# Patient Record
Sex: Male | Born: 1935 | Race: White | Hispanic: No | Smoking: Never smoker
Health system: Southern US, Community
[De-identification: ages and names within clinical notes are randomized; demographics above are authoritative.]

## PROBLEM LIST (undated history)

## (undated) DIAGNOSIS — E039 Hypothyroidism, unspecified: Secondary | ICD-10-CM

## (undated) DIAGNOSIS — I350 Nonrheumatic aortic (valve) stenosis: Secondary | ICD-10-CM

## (undated) DIAGNOSIS — I4891 Unspecified atrial fibrillation: Secondary | ICD-10-CM

## (undated) DIAGNOSIS — D649 Anemia, unspecified: Secondary | ICD-10-CM

## (undated) DIAGNOSIS — N320 Bladder-neck obstruction: Secondary | ICD-10-CM

## (undated) DIAGNOSIS — H409 Unspecified glaucoma: Secondary | ICD-10-CM

## (undated) DIAGNOSIS — Z8619 Personal history of other infectious and parasitic diseases: Secondary | ICD-10-CM

## (undated) DIAGNOSIS — E785 Hyperlipidemia, unspecified: Secondary | ICD-10-CM

## (undated) DIAGNOSIS — G500 Trigeminal neuralgia: Secondary | ICD-10-CM

## (undated) DIAGNOSIS — H3530 Unspecified macular degeneration: Secondary | ICD-10-CM

## (undated) HISTORY — DX: Nonrheumatic aortic (valve) stenosis: I35.0

## (undated) HISTORY — DX: Unspecified glaucoma: H40.9

## (undated) HISTORY — DX: Trigeminal neuralgia: G50.0

## (undated) HISTORY — DX: Anemia, unspecified: D64.9

## (undated) HISTORY — DX: Hyperlipidemia, unspecified: E78.5

## (undated) HISTORY — DX: Unspecified atrial fibrillation: I48.91

## (undated) HISTORY — DX: Personal history of other infectious and parasitic diseases: Z86.19

## (undated) HISTORY — DX: Unspecified macular degeneration: H35.30

## (undated) HISTORY — DX: Bladder-neck obstruction: N32.0

## (undated) HISTORY — DX: Hypothyroidism, unspecified: E03.9

## (undated) HISTORY — PX: TONSILLECTOMY: SUR1361

---

## 1996-06-12 HISTORY — PX: ANAL FISSURECTOMY: SUR608

## 1997-06-12 HISTORY — PX: CATARACT EXTRACTION: SUR2

## 2001-10-17 HISTORY — PX: STEREOTACTIC RADIOSURGERY / PALLIDOTOMY: SUR1311

## 2004-04-26 ENCOUNTER — Ambulatory Visit: Payer: Self-pay | Admitting: Gastroenterology

## 2004-05-20 ENCOUNTER — Ambulatory Visit: Payer: Self-pay

## 2004-05-31 ENCOUNTER — Ambulatory Visit: Payer: Self-pay | Admitting: Unknown Physician Specialty

## 2006-03-15 ENCOUNTER — Ambulatory Visit: Payer: Self-pay | Admitting: Neurology

## 2010-10-14 ENCOUNTER — Ambulatory Visit: Payer: Self-pay

## 2010-10-15 ENCOUNTER — Emergency Department: Payer: Self-pay | Admitting: Internal Medicine

## 2010-10-17 ENCOUNTER — Encounter: Payer: Self-pay | Admitting: Internal Medicine

## 2012-09-08 ENCOUNTER — Emergency Department: Payer: Self-pay | Admitting: Emergency Medicine

## 2012-09-20 ENCOUNTER — Encounter: Payer: Self-pay | Admitting: Internal Medicine

## 2012-10-10 ENCOUNTER — Encounter: Payer: Self-pay | Admitting: Internal Medicine

## 2012-11-10 ENCOUNTER — Encounter: Payer: Self-pay | Admitting: Internal Medicine

## 2012-11-13 ENCOUNTER — Ambulatory Visit: Payer: Self-pay | Admitting: Internal Medicine

## 2012-11-14 LAB — CBC WITH DIFFERENTIAL/PLATELET
Basophil #: 0.1 10*3/uL (ref 0.0–0.1)
Basophil %: 0.9 %
Eosinophil #: 0.2 10*3/uL (ref 0.0–0.7)
Eosinophil %: 1.8 %
HGB: 11.9 g/dL — ABNORMAL LOW (ref 13.0–18.0)
Lymphocyte #: 2.7 10*3/uL (ref 1.0–3.6)
Lymphocyte %: 28 %
MCH: 32.5 pg (ref 26.0–34.0)
MCHC: 33.9 g/dL (ref 32.0–36.0)
Monocyte #: 2.1 x10 3/mm — ABNORMAL HIGH (ref 0.2–1.0)
Monocyte %: 22 %
Neutrophil %: 47.3 %
Platelet: 295 10*3/uL (ref 150–440)
RDW: 19.1 % — ABNORMAL HIGH (ref 11.5–14.5)
WBC: 9.6 10*3/uL (ref 3.8–10.6)

## 2012-11-14 LAB — COMPREHENSIVE METABOLIC PANEL
Albumin: 3.2 g/dL — ABNORMAL LOW (ref 3.4–5.0)
Alkaline Phosphatase: 85 U/L (ref 50–136)
Bilirubin,Total: 0.7 mg/dL (ref 0.2–1.0)
Calcium, Total: 8.6 mg/dL (ref 8.5–10.1)
Chloride: 104 mmol/L (ref 98–107)
Creatinine: 0.96 mg/dL (ref 0.60–1.30)
Glucose: 83 mg/dL (ref 65–99)
Osmolality: 275 (ref 275–301)
SGPT (ALT): 25 U/L (ref 12–78)
Sodium: 137 mmol/L (ref 136–145)

## 2012-11-14 LAB — TSH: Thyroid Stimulating Horm: 29.3 u[IU]/mL — ABNORMAL HIGH

## 2012-11-14 LAB — DIGOXIN LEVEL: Digoxin: 0.66 ng/mL

## 2012-12-10 ENCOUNTER — Encounter: Payer: Self-pay | Admitting: Internal Medicine

## 2013-01-10 ENCOUNTER — Encounter: Payer: Self-pay | Admitting: Internal Medicine

## 2013-02-04 LAB — BASIC METABOLIC PANEL WITH GFR
Anion Gap: 5 — ABNORMAL LOW
BUN: 15 mg/dL
Calcium, Total: 8.7 mg/dL
Chloride: 105 mmol/L
Co2: 27 mmol/L
Creatinine: 0.97 mg/dL
EGFR (African American): >60
EGFR (Non-African Amer.): >60
Glucose: 79 mg/dL
Osmolality: 274
Potassium: 4.1 mmol/L
Sodium: 137 mmol/L

## 2013-02-10 ENCOUNTER — Encounter: Payer: Self-pay | Admitting: Internal Medicine

## 2013-02-11 LAB — BASIC METABOLIC PANEL
BUN: 14 mg/dL (ref 7–18)
Chloride: 105 mmol/L (ref 98–107)
Co2: 28 mmol/L (ref 21–32)
EGFR (African American): >60
EGFR (Non-African Amer.): >60
Glucose: 79 mg/dL (ref 65–99)
Osmolality: 275 (ref 275–301)
Sodium: 138 mmol/L (ref 136–145)

## 2013-03-12 ENCOUNTER — Encounter: Payer: Self-pay | Admitting: Internal Medicine

## 2013-03-18 ENCOUNTER — Inpatient Hospital Stay: Payer: Self-pay | Admitting: Internal Medicine

## 2013-03-18 LAB — TROPONIN I
Troponin-I: 0.06 ng/mL — ABNORMAL HIGH
Troponin-I: 0.14 ng/mL — ABNORMAL HIGH

## 2013-03-18 LAB — URINALYSIS, COMPLETE
Bilirubin,UR: NEGATIVE
Glucose,UR: NEGATIVE mg/dL (ref 0–75)
Leukocyte Esterase: NEGATIVE
Nitrite: NEGATIVE
Ph: 5 (ref 4.5–8.0)
Protein: 100
RBC,UR: 1 /HPF (ref 0–5)
Squamous Epithelial: <1
WBC UR: 1 /HPF (ref 0–5)

## 2013-03-18 LAB — COMPREHENSIVE METABOLIC PANEL
Anion Gap: 7 (ref 7–16)
Chloride: 104 mmol/L (ref 98–107)
Co2: 25 mmol/L (ref 21–32)
Creatinine: 1.59 mg/dL — ABNORMAL HIGH (ref 0.60–1.30)
Glucose: 110 mg/dL — ABNORMAL HIGH (ref 65–99)
Osmolality: 278 (ref 275–301)
SGPT (ALT): 27 U/L (ref 12–78)
Sodium: 136 mmol/L (ref 136–145)
Total Protein: 6.3 g/dL — ABNORMAL LOW (ref 6.4–8.2)

## 2013-03-18 LAB — CBC
HCT: 24.6 % — ABNORMAL LOW (ref 40.0–52.0)
HGB: 7.8 g/dL — ABNORMAL LOW (ref 13.0–18.0)
MCV: 81 fL (ref 80–100)
Platelet: 331 10*3/uL (ref 150–440)
RBC: 3.05 10*6/uL — ABNORMAL LOW (ref 4.40–5.90)
RDW: 19.1 % — ABNORMAL HIGH (ref 11.5–14.5)

## 2013-03-18 LAB — CK TOTAL AND CKMB (NOT AT ARMC)
CK, Total: 226 U/L (ref 35–232)
CK-MB: 3.3 ng/mL (ref 0.5–3.6)

## 2013-03-18 LAB — HEMOGLOBIN: HGB: 7.4 g/dL — ABNORMAL LOW (ref 13.0–18.0)

## 2013-03-18 LAB — TSH: Thyroid Stimulating Horm: 4.42 u[IU]/mL

## 2013-03-19 ENCOUNTER — Ambulatory Visit: Payer: Self-pay | Admitting: Internal Medicine

## 2013-03-19 LAB — COMPREHENSIVE METABOLIC PANEL
Albumin: 3.2 g/dL — ABNORMAL LOW (ref 3.4–5.0)
Alkaline Phosphatase: 76 U/L (ref 50–136)
Anion Gap: 6 — ABNORMAL LOW (ref 7–16)
BUN: 30 mg/dL — ABNORMAL HIGH (ref 7–18)
Bilirubin,Total: 0.8 mg/dL (ref 0.2–1.0)
Calcium, Total: 8.3 mg/dL — ABNORMAL LOW (ref 8.5–10.1)
Chloride: 106 mmol/L (ref 98–107)
Co2: 25 mmol/L (ref 21–32)
Creatinine: 1.37 mg/dL — ABNORMAL HIGH (ref 0.60–1.30)
EGFR (Non-African Amer.): 42 — ABNORMAL LOW
Glucose: 102 mg/dL — ABNORMAL HIGH (ref 65–99)
Osmolality: 280 (ref 275–301)
Potassium: 4.5 mmol/L (ref 3.5–5.1)

## 2013-03-19 LAB — CBC WITH DIFFERENTIAL/PLATELET
Eosinophil %: 0.8 %
HCT: 22.1 % — ABNORMAL LOW (ref 40.0–52.0)
HGB: 7.3 g/dL — ABNORMAL LOW (ref 13.0–18.0)
Lymphocyte #: 1.9 10*3/uL (ref 1.0–3.6)
Lymphocyte %: 25.1 %
MCV: 79 fL — ABNORMAL LOW (ref 80–100)
Monocyte #: 2.4 x10 3/mm — ABNORMAL HIGH (ref 0.2–1.0)
Monocyte %: 32.5 %
Platelet: 286 10*3/uL (ref 150–440)
RBC: 2.79 10*6/uL — ABNORMAL LOW (ref 4.40–5.90)
WBC: 7.4 10*3/uL (ref 3.8–10.6)

## 2013-03-19 LAB — APTT: Activated PTT: >160 secs (ref 23.6–35.9)

## 2013-03-19 LAB — CK TOTAL AND CKMB (NOT AT ARMC): CK-MB: 3 ng/mL (ref 0.5–3.6)

## 2013-03-19 LAB — HEMOGLOBIN: HGB: 7.4 g/dL — ABNORMAL LOW (ref 13.0–18.0)

## 2013-03-19 LAB — TROPONIN I: Troponin-I: 0.14 ng/mL — ABNORMAL HIGH

## 2013-03-20 ENCOUNTER — Encounter: Payer: Self-pay | Admitting: Internal Medicine

## 2013-03-20 LAB — BASIC METABOLIC PANEL
Anion Gap: 7 (ref 7–16)
Calcium, Total: 8.3 mg/dL — ABNORMAL LOW (ref 8.5–10.1)
Chloride: 106 mmol/L (ref 98–107)
Co2: 23 mmol/L (ref 21–32)
Creatinine: 1.31 mg/dL — ABNORMAL HIGH (ref 0.60–1.30)
EGFR (African American): 52 — ABNORMAL LOW
EGFR (Non-African Amer.): 45 — ABNORMAL LOW
Glucose: 84 mg/dL (ref 65–99)
Osmolality: 276 (ref 275–301)
Sodium: 136 mmol/L (ref 136–145)

## 2013-03-20 LAB — CBC WITH DIFFERENTIAL/PLATELET
Basophil #: 0.1 10*3/uL (ref 0.0–0.1)
Basophil %: 1.5 %
Eosinophil %: 2.8 %
HGB: 9.3 g/dL — ABNORMAL LOW (ref 13.0–18.0)
Lymphocyte #: 2.1 10*3/uL (ref 1.0–3.6)
Lymphocyte %: 24.4 %
MCHC: 32.7 g/dL (ref 32.0–36.0)
MCV: 81 fL (ref 80–100)
Monocyte #: 2.2 x10 3/mm — ABNORMAL HIGH (ref 0.2–1.0)
Monocyte %: 26.4 %
Neutrophil #: 3.8 10*3/uL (ref 1.4–6.5)
Neutrophil %: 44.9 %
Platelet: 302 10*3/uL (ref 150–440)
RDW: 17.7 % — ABNORMAL HIGH (ref 11.5–14.5)
WBC: 8.4 10*3/uL (ref 3.8–10.6)

## 2013-03-26 LAB — CBC WITH DIFFERENTIAL/PLATELET
Basophil #: 0.2 10*3/uL — ABNORMAL HIGH (ref 0.0–0.1)
Eosinophil #: 0.2 10*3/uL (ref 0.0–0.7)
HCT: 31.1 % — ABNORMAL LOW (ref 40.0–52.0)
Lymphocyte #: 2 10*3/uL (ref 1.0–3.6)
Lymphocyte %: 19.9 %
MCHC: 32.5 g/dL (ref 32.0–36.0)
Monocyte %: 23.7 %
Neutrophil #: 5.2 10*3/uL (ref 1.4–6.5)
Platelet: 326 10*3/uL (ref 150–440)
RBC: 3.79 10*6/uL — ABNORMAL LOW (ref 4.40–5.90)
RDW: 19 % — ABNORMAL HIGH (ref 11.5–14.5)
WBC: 9.9 10*3/uL (ref 3.8–10.6)

## 2013-03-27 LAB — CBC WITH DIFFERENTIAL/PLATELET
Eosinophil #: 0.3 10*3/uL (ref 0.0–0.7)
HCT: 34.5 % — ABNORMAL LOW (ref 40.0–52.0)
HGB: 11.1 g/dL — ABNORMAL LOW (ref 13.0–18.0)
Lymphocyte %: 25.7 %
MCHC: 32.2 g/dL (ref 32.0–36.0)
Neutrophil %: 49.1 %
RBC: 4.15 10*6/uL — ABNORMAL LOW (ref 4.40–5.90)
RDW: 19.3 % — ABNORMAL HIGH (ref 11.5–14.5)
WBC: 8.5 10*3/uL (ref 3.8–10.6)

## 2013-03-27 LAB — BASIC METABOLIC PANEL
Calcium, Total: 9 mg/dL (ref 8.5–10.1)
Co2: 27 mmol/L (ref 21–32)
Glucose: 83 mg/dL (ref 65–99)
Osmolality: 274 (ref 275–301)
Sodium: 138 mmol/L (ref 136–145)

## 2013-04-01 LAB — BASIC METABOLIC PANEL
BUN: 11 mg/dL (ref 7–18)
Calcium, Total: 8.7 mg/dL (ref 8.5–10.1)
Chloride: 107 mmol/L (ref 98–107)
Co2: 26 mmol/L (ref 21–32)
EGFR (Non-African Amer.): >60
Glucose: 81 mg/dL (ref 65–99)
Potassium: 3.3 mmol/L — ABNORMAL LOW (ref 3.5–5.1)
Sodium: 140 mmol/L (ref 136–145)

## 2013-04-01 LAB — CBC WITH DIFFERENTIAL/PLATELET
Basophil %: 1.4 %
Eosinophil #: 0.2 10*3/uL (ref 0.0–0.7)
HGB: 11.4 g/dL — ABNORMAL LOW (ref 13.0–18.0)
Lymphocyte #: 2 10*3/uL (ref 1.0–3.6)
Lymphocyte %: 22.1 %
MCV: 83 fL (ref 80–100)
Neutrophil #: 4.6 10*3/uL (ref 1.4–6.5)
Neutrophil %: 51.3 %
Platelet: 351 10*3/uL (ref 150–440)
WBC: 9 10*3/uL (ref 3.8–10.6)

## 2013-04-03 LAB — CBC WITH DIFFERENTIAL/PLATELET
Basophil #: 0.2 10*3/uL — ABNORMAL HIGH (ref 0.0–0.1)
Eosinophil #: 0.2 10*3/uL (ref 0.0–0.7)
Eosinophil %: 3.2 %
HGB: 11.8 g/dL — ABNORMAL LOW (ref 13.0–18.0)
Lymphocyte #: 2.9 10*3/uL (ref 1.0–3.6)
MCH: 27 pg (ref 26.0–34.0)
MCHC: 32.5 g/dL (ref 32.0–36.0)
MCV: 83 fL (ref 80–100)
Monocyte #: 1.5 x10 3/mm — ABNORMAL HIGH (ref 0.2–1.0)
Monocyte %: 18.7 %
Neutrophil %: 38.6 %
RDW: 21.8 % — ABNORMAL HIGH (ref 11.5–14.5)

## 2013-04-10 LAB — CBC WITH DIFFERENTIAL/PLATELET
Basophil %: 2.1 %
Eosinophil %: 4.9 %
HGB: 11 g/dL — ABNORMAL LOW (ref 13.0–18.0)
MCHC: 32.8 g/dL (ref 32.0–36.0)
Monocyte #: 1.3 x10 3/mm — ABNORMAL HIGH (ref 0.2–1.0)
Neutrophil #: 2.2 10*3/uL (ref 1.4–6.5)
Neutrophil %: 35.3 %
Platelet: 306 10*3/uL (ref 150–440)
RDW: 23.6 % — ABNORMAL HIGH (ref 11.5–14.5)
WBC: 6.2 10*3/uL (ref 3.8–10.6)

## 2013-04-12 ENCOUNTER — Ambulatory Visit: Payer: Self-pay | Admitting: Internal Medicine

## 2013-04-12 ENCOUNTER — Encounter: Payer: Self-pay | Admitting: Internal Medicine

## 2013-04-12 ENCOUNTER — Ambulatory Visit: Payer: Self-pay | Admitting: Nurse Practitioner

## 2013-04-24 ENCOUNTER — Encounter: Payer: Self-pay | Admitting: Internal Medicine

## 2013-04-24 LAB — CBC WITH DIFFERENTIAL/PLATELET
Basophil %: 0.3 %
Eosinophil #: 0 10*3/uL (ref 0.0–0.7)
Eosinophil %: 0 %
HGB: 11.9 g/dL — ABNORMAL LOW (ref 13.0–18.0)
Lymphocyte #: 1.5 10*3/uL (ref 1.0–3.6)
MCV: 84 fL (ref 80–100)
Monocyte #: 0.9 x10 3/mm (ref 0.2–1.0)
Neutrophil #: 2.6 10*3/uL (ref 1.4–6.5)
Platelet: 324 10*3/uL (ref 150–440)
RBC: 4.3 10*6/uL — ABNORMAL LOW (ref 4.40–5.90)

## 2013-05-12 ENCOUNTER — Encounter: Payer: Self-pay | Admitting: Internal Medicine

## 2013-06-12 ENCOUNTER — Encounter: Payer: Self-pay | Admitting: Internal Medicine

## 2013-06-19 LAB — CBC WITH DIFFERENTIAL/PLATELET
BASOS ABS: 0.1 10*3/uL (ref 0.0–0.1)
Basophil %: 1.8 %
Eosinophil #: 0.2 10*3/uL (ref 0.0–0.7)
Eosinophil %: 3.4 %
HCT: 37.8 % — ABNORMAL LOW (ref 40.0–52.0)
HGB: 12.8 g/dL — ABNORMAL LOW (ref 13.0–18.0)
LYMPHS ABS: 2.1 10*3/uL (ref 1.0–3.6)
Lymphocyte %: 30.2 %
MCH: 31.1 pg (ref 26.0–34.0)
MCHC: 33.7 g/dL (ref 32.0–36.0)
MCV: 92 fL (ref 80–100)
Monocyte #: 1.5 x10 3/mm — ABNORMAL HIGH (ref 0.2–1.0)
Monocyte %: 21.2 %
NEUTROS ABS: 3 10*3/uL (ref 1.4–6.5)
NEUTROS PCT: 43.4 %
Platelet: 325 10*3/uL (ref 150–440)
RBC: 4.11 10*6/uL — ABNORMAL LOW (ref 4.40–5.90)
RDW: 24.7 % — AB (ref 11.5–14.5)
WBC: 7 10*3/uL (ref 3.8–10.6)

## 2013-07-13 ENCOUNTER — Encounter: Payer: Self-pay | Admitting: Internal Medicine

## 2013-08-10 ENCOUNTER — Ambulatory Visit: Payer: Self-pay | Admitting: Nurse Practitioner

## 2013-08-10 ENCOUNTER — Encounter: Payer: Self-pay | Admitting: Internal Medicine

## 2013-09-10 ENCOUNTER — Encounter: Payer: Self-pay | Admitting: Internal Medicine

## 2013-10-10 ENCOUNTER — Encounter: Payer: Self-pay | Admitting: Internal Medicine

## 2013-10-10 ENCOUNTER — Ambulatory Visit: Payer: Self-pay | Admitting: Nurse Practitioner

## 2013-11-10 ENCOUNTER — Encounter: Payer: Self-pay | Admitting: Internal Medicine

## 2013-11-10 ENCOUNTER — Ambulatory Visit
Admit: 2013-11-10 | Disposition: A | Discharge status: Home / Self Care | Payer: Self-pay | Attending: Nurse Practitioner | Admitting: Nurse Practitioner

## 2013-12-01 ENCOUNTER — Inpatient Hospital Stay: Payer: Self-pay | Admitting: Internal Medicine

## 2013-12-01 LAB — BASIC METABOLIC PANEL
Anion Gap: 5 — ABNORMAL LOW (ref 7–16)
BUN: 15 mg/dL (ref 7–18)
CALCIUM: 9 mg/dL (ref 8.5–10.1)
CO2: 29 mmol/L (ref 21–32)
CREATININE: 1.06 mg/dL (ref 0.60–1.30)
Chloride: 103 mmol/L (ref 98–107)
EGFR (Non-African Amer.): 58 — ABNORMAL LOW
Glucose: 114 mg/dL — ABNORMAL HIGH (ref 65–99)
Osmolality: 276 (ref 275–301)
Potassium: 4.2 mmol/L (ref 3.5–5.1)
Sodium: 137 mmol/L (ref 136–145)

## 2013-12-01 LAB — CBC
HCT: 33.6 % — ABNORMAL LOW (ref 40.0–52.0)
HGB: 11.1 g/dL — ABNORMAL LOW (ref 13.0–18.0)
MCH: 34 pg (ref 26.0–34.0)
MCHC: 33 g/dL (ref 32.0–36.0)
MCV: 103 fL — ABNORMAL HIGH (ref 80–100)
PLATELETS: 271 10*3/uL (ref 150–440)
RBC: 3.26 10*6/uL — ABNORMAL LOW (ref 4.40–5.90)
RDW: 17.6 % — AB (ref 11.5–14.5)
WBC: 7.6 10*3/uL (ref 3.8–10.6)

## 2013-12-01 LAB — TROPONIN I: Troponin-I: <0.02 ng/mL

## 2013-12-02 LAB — CBC WITH DIFFERENTIAL/PLATELET
BASOS ABS: 0.1 10*3/uL (ref 0.0–0.1)
BASOS PCT: 0.8 %
Eosinophil #: 0.1 10*3/uL (ref 0.0–0.7)
Eosinophil %: 0.8 %
HCT: 33.5 % — ABNORMAL LOW (ref 40.0–52.0)
HGB: 11.2 g/dL — AB (ref 13.0–18.0)
Lymphocyte #: 1 10*3/uL (ref 1.0–3.6)
Lymphocyte %: 9 %
MCH: 34.5 pg — ABNORMAL HIGH (ref 26.0–34.0)
MCHC: 33.4 g/dL (ref 32.0–36.0)
MCV: 103 fL — ABNORMAL HIGH (ref 80–100)
MONOS PCT: 27.8 %
Monocyte #: 3.2 x10 3/mm — ABNORMAL HIGH (ref 0.2–1.0)
NEUTROS PCT: 61.6 %
Neutrophil #: 7.1 10*3/uL — ABNORMAL HIGH (ref 1.4–6.5)
Platelet: 245 10*3/uL (ref 150–440)
RBC: 3.24 10*6/uL — ABNORMAL LOW (ref 4.40–5.90)
RDW: 17.1 % — ABNORMAL HIGH (ref 11.5–14.5)
WBC: 11.6 10*3/uL — ABNORMAL HIGH (ref 3.8–10.6)

## 2013-12-02 LAB — BASIC METABOLIC PANEL
Anion Gap: 5 — ABNORMAL LOW (ref 7–16)
BUN: 19 mg/dL — ABNORMAL HIGH (ref 7–18)
Calcium, Total: 8.8 mg/dL (ref 8.5–10.1)
Chloride: 102 mmol/L (ref 98–107)
Co2: 29 mmol/L (ref 21–32)
Creatinine: 1.16 mg/dL (ref 0.60–1.30)
EGFR (Non-African Amer.): 52 — ABNORMAL LOW
GFR CALC AF AMER: 60 — AB
GLUCOSE: 108 mg/dL — AB (ref 65–99)
OSMOLALITY: 275 (ref 275–301)
Potassium: 4.3 mmol/L (ref 3.5–5.1)
Sodium: 136 mmol/L (ref 136–145)

## 2013-12-02 LAB — MAGNESIUM: MAGNESIUM: 2 mg/dL

## 2013-12-10 ENCOUNTER — Encounter: Payer: Self-pay | Admitting: Internal Medicine

## 2013-12-22 ENCOUNTER — Emergency Department: Payer: Self-pay | Admitting: Emergency Medicine

## 2014-01-10 ENCOUNTER — Encounter: Payer: Self-pay | Admitting: Internal Medicine

## 2014-02-10 ENCOUNTER — Encounter: Payer: Self-pay | Admitting: Internal Medicine

## 2014-02-22 LAB — URINALYSIS, COMPLETE
BILIRUBIN, UR: NEGATIVE
BLOOD: NEGATIVE
Bacteria: NONE SEEN
GLUCOSE, UR: NEGATIVE mg/dL (ref 0–75)
KETONE: NEGATIVE
LEUKOCYTE ESTERASE: NEGATIVE
NITRITE: NEGATIVE
Ph: 5 (ref 4.5–8.0)
Protein: NEGATIVE
RBC, UR: NONE SEEN /HPF (ref 0–5)
SPECIFIC GRAVITY: 1.017 (ref 1.003–1.030)
Squamous Epithelial: NONE SEEN
WBC UR: 1 /HPF (ref 0–5)

## 2014-02-24 LAB — URINE CULTURE

## 2014-03-12 ENCOUNTER — Encounter: Payer: Self-pay | Admitting: Internal Medicine

## 2014-04-12 ENCOUNTER — Encounter: Payer: Self-pay | Admitting: Internal Medicine

## 2014-05-12 ENCOUNTER — Encounter: Payer: Self-pay | Admitting: Internal Medicine

## 2014-05-14 LAB — BASIC METABOLIC PANEL
ANION GAP: 8 (ref 7–16)
BUN: 14 mg/dL (ref 7–18)
Calcium, Total: 8.6 mg/dL (ref 8.5–10.1)
Chloride: 105 mmol/L (ref 98–107)
Co2: 27 mmol/L (ref 21–32)
Creatinine: 1.03 mg/dL (ref 0.60–1.30)
EGFR (African American): >60
Glucose: 81 mg/dL (ref 65–99)
Osmolality: 279 (ref 275–301)
POTASSIUM: 3.8 mmol/L (ref 3.5–5.1)
SODIUM: 140 mmol/L (ref 136–145)

## 2014-05-14 LAB — TSH: THYROID STIMULATING HORM: 22.9 u[IU]/mL — AB

## 2014-06-12 ENCOUNTER — Encounter: Payer: Self-pay | Admitting: Internal Medicine

## 2014-07-09 LAB — TSH: THYROID STIMULATING HORM: 0.591 u[IU]/mL

## 2014-07-13 ENCOUNTER — Encounter: Payer: Self-pay | Admitting: Internal Medicine

## 2014-08-11 ENCOUNTER — Encounter
Admit: 2014-08-11 | Disposition: A | Discharge status: Home / Self Care | Payer: Self-pay | Attending: Internal Medicine | Admitting: Internal Medicine

## 2014-09-11 ENCOUNTER — Encounter
Admit: 2014-09-11 | Disposition: A | Discharge status: Home / Self Care | Payer: Self-pay | Attending: Internal Medicine | Admitting: Internal Medicine

## 2014-09-11 ENCOUNTER — Ambulatory Visit: Payer: Self-pay | Admitting: Nurse Practitioner

## 2014-10-02 NOTE — Consult Note (Signed)
Brief Consult Note: Diagnosis: Anemia, + FOBT.   Patient was seen by consultant.   Consult note dictated.   Discussed with Attending MD.   Comments: 81 y/o male with anemia and + FOBT.  Given his age and  comorbidities, doubt the benefit of EGD or colonoscopy would outweight the risk.  Also unlikely to change management.  Agree with blood transfusion, PO iron therapy,  monitoring of h/h.  No further recs at this time.  Electronic Signatures: Dow Adolphein, Rainee Sweatt (MD)  (Signed 08-Oct-14 12:19)  Authored: Brief Consult Note   Last Updated: 08-Oct-14 12:19 by Dow Adolphein, Saundra Gin (MD)

## 2014-10-02 NOTE — Consult Note (Signed)
General Aspect 81 year old male with history of aortic stenosis mitral stenosis atrial fibrillation and coronary artery disease presenting to [facility] after he had a syncopal episode at his assisted care facility this a.m.  It was witnessed by his caretaker and was sitting on the side of the bed and slumped over.  By the time the daughter arrived at the facility the patient had normal mentation.  He did appear very pale.  He has been taking more pain pills due to chronic back pain over the last few weeks.  He did have a pain pill last night.  His EKG revealed ST depression in V4 through V6.  His troponin is barely elevated at 0.06.  He recently had some outpatient Tests performed by Dr. Satira Mccallum which a Holter monitor revealed atrophic relation with severe herbal ventricular response somewhat slow at times no pulses.  He also had an echocardiogram that showed normal LV function with trace mitral and moderate to severe tricuspid insufficiency with  moderate aortic stenosis and moderate mitral valve stenosis.  He currently is very talkative in the ER and without complaints.  He is anemic with a hemoglobin of 7.8 and creatinine of 1.6. Will follow serial enzymes are currently suspecting demand ischemia from renal insufficiency and anemia.According to his office note he is not a good candidate for percutaneous or surgical valvular intervention.   Physical Exam:  GEN well developed, no acute distress   HEENT pale conjunctivae, hearing intact to voice, moist oral mucosa   RESP normal resp effort   CARD Irregular rate and rhythm  Murmur  A. fib controlled   Murmur Systolic   Systolic Murmur Out flow   ABD soft   EXTR negative edema, TED hose on   SKIN skin turgor decreased   NEURO cranial nerves intact, motor/sensory function intact   PSYCH alert, A+O to time, place, person, Pleasantly confused   Review of Systems:  Subjective/Chief Complaint Syncope   Neurologic: Fainting   Review of Systems:  All other systems were reviewed and found to be negative   Lab Results: Thyroid:  07-Oct-14 09:50   Thyroid Stimulating Hormone 4.42 (0.45-4.50 (International Unit)  ----------------------- Pregnant patients have  different reference  ranges for TSH:  - - - - - - - - - -  Pregnant, first trimetser:  0.36 - 2.50 uIU/mL)  Hepatic:  07-Oct-14 09:50   Bilirubin, Total 0.9  Alkaline Phosphatase 87  SGPT (ALT) 27  SGOT (AST) 29  Total Protein, Serum  6.3  Albumin, Serum 3.5  TDMs:  07-Oct-14 09:50   Digoxin, Serum 0.9 (Therapeutic range for digoxin in patients with atrial fibrillation: 0.8 - 2.0 ng/mL. In patients with congestive heart failure a therapeutic range of 0.5 - 0.8 ng/mL is suggested as higher levels are associated with an increased risk of toxicity without clear evidence of enhanced efficacy. Digoxin toxicity is commonly associated with serum levels > 2.0 ng/mL but may occur with lower levels, including those in the therapeutic range. Blood samples should be obtained 6-8 hours after administration to assure a reasonable volume of distribution.)  Routine Chem:  07-Oct-14 09:50   Glucose, Serum  110  BUN  28  Creatinine (comp)  1.59  Sodium, Serum 136  Potassium, Serum 4.5  Chloride, Serum 104  CO2, Serum 25  Calcium (Total), Serum 8.8  Osmolality (calc) 278  eGFR (African American)  41  eGFR (Non-African American)  35 (eGFR values <47m/min/1.73 m2 may be an indication of chronic kidney disease (CKD). Calculated  eGFR is useful in patients with stable renal function. The eGFR calculation will not be reliable in acutely ill patients when serum creatinine is changing rapidly. It is not useful in  patients on dialysis. The eGFR calculation may not be applicable to patients at the low and high extremes of body sizes, pregnant women, and vegetarians.)  Anion Gap 7  Result Comment TROPONIN - RESULTS VERIFIED BY REPEAT TESTING.  - C/DR.WILLIAMS AT 1129  03/18/13-DAS  - READ-BACK PROCESS PERFORMED.  Result(s) reported on 18 Mar 2013 at 11:31AM.  Cardiac:  07-Oct-14 09:50   Troponin I  0.06 (0.00-0.05 0.05 ng/mL or less: NEGATIVE  Repeat testing in 3-6 hrs  if clinically indicated. >0.05 ng/mL: POTENTIAL  MYOCARDIAL INJURY. Repeat  testing in 3-6 hrs if  clinically indicated. NOTE: An increase or decrease  of 30% or more on serial  testing suggests a  clinically important change)  CK, Total 226  CPK-MB, Serum 3.3 (Result(s) reported on 18 Mar 2013 at 11:00AM.)  Routine UA:  07-Oct-14 12:51   Color (UA) Yellow  Clarity (UA) Hazy  Glucose (UA) Negative  Bilirubin (UA) Negative  Ketones (UA) Trace  Specific Gravity (UA) 1.023  Blood (UA) Negative  pH (UA) 5.0  Protein (UA) 100 mg/dL  Nitrite (UA) Negative  Leukocyte Esterase (UA) Negative (Result(s) reported on 18 Mar 2013 at 01:34PM.)  RBC (UA) 1 /HPF  WBC (UA) 1 /HPF  Bacteria (UA) NONE SEEN  Epithelial Cells (UA) <1 /HPF  Mucous (UA) PRESENT  Hyaline Cast (UA) 17 /LPF (Result(s) reported on 18 Mar 2013 at 01:34PM.)  Routine Hem:  07-Oct-14 09:50   WBC (CBC)  11.0  RBC (CBC)  3.05  Hemoglobin (CBC)  7.8  Hematocrit (CBC)  24.6  Platelet Count (CBC) 331 (Result(s) reported on 18 Mar 2013 at 10:42AM.)  MCV 81  MCH  25.6  MCHC  31.8  RDW  19.1   Radiology Results: XRay:    07-Oct-14 10:57, Chest PA and Lateral  Chest PA and Lateral   REASON FOR EXAM:    dyspnea  COMMENTS:       PROCEDURE: DXR - DXR CHEST PA (OR AP) AND LATERAL  - Mar 18 2013 10:57AM     RESULT: Comparison is made to the study of 11/13/2012.    Dense atherosclerotic calcification is seen in the aorta. The heart is   mildly enlarged. The lung markings are coarse. There is no consolidation,   effusion or pneumothorax. Degenerative changes and osteopenia are present   the bony structures.    IMPRESSION:  Underlying mild cardiomegaly with COPD and possibly some   underlying fibrosis or mild  interstitial edema.  Dictation Site: 2        Verified By: Sundra Aland, M.D., MD  CT:    07-Oct-14 10:47, CT Head Without Contrast  CT Head Without Contrast   REASON FOR EXAM:    syncope  COMMENTS:       PROCEDURE: CT  - CT HEAD WITHOUT CONTRAST  - Mar 18 2013 10:47AM     RESULT: Comparison:  None    Technique: Multiple axial images from the foramen magnum to the vertex   were obtained without IV contrast.    Findings:      There is no evidence of mass effect, midline shift, or extra-axial fluid   collections.  There is no evidence of a space-occupying lesion or   intracranial hemorrhage. There is no evidence of a cortical-based area of  acute infarction. There is generalized cerebral atrophy. There is   periventricular Slappey matter low attenuation likely secondary to   microangiopathy.    The ventricles and sulci are appropriate for the patient's age. The basal   cisterns are patent.    Visualized portions of the orbits are unremarkable. The visualized   portions of the paranasal sinuses and mastoid air cells are unremarkable.   Cerebrovascular atherosclerotic calcifications are noted.    The osseous structures are unremarkable.    IMPRESSION:    No acute intracranial process.        Dictation Site: 1        Verified By: Jennette Banker, M.D., MD    Tegretol: Unknown  Vital Signs/Nurse's Notes: **Vital Signs.:   07-Oct-14 17:05  Temperature Temperature (F) 97.3  Celsius 36.2  Temperature Source oral  Pulse Pulse 91  Respirations Respirations 18  Systolic BP Systolic BP 360  Diastolic BP (mmHg) Diastolic BP (mmHg) 73  Mean BP 93  Pulse Ox % Pulse Ox % 97  Pulse Ox Activity Level  At rest  Oxygen Delivery Room Air/ 21 %    Impression 81 year old male with history of CAD, Borderline troponin, chronic Atrial fibrillation with variable ventricular response, aortic and mitral valve stenosis, Anemia, renal insufficiency with syncope, Questionable  etiology.   Plan 1.  Continue to watch serial enzymes but for now think the borderline Troponin elevation is due to demand ischemia from anemia and renal insufficiency and  not acute coronary syndrome. 2.  Address anemia, renal insufficiency, possible dehydration,  3.  Aortic and mitral stenosis, patient thought not to be a good candidate for either percutaneous or surgical intervention.A repeat surface echocardiogram is not needed since he just had one outpatient 02/06/13. 4.Watch for significant arrhythmias on telemetry especially bradycardia or pauses that might indicate discontinuation of digoxin and/or the need for pacemaker. 5.  Further review and recommendations per Dr. Nehemiah Massed.   Electronic Signatures: Roderic Palau (NP)  (Signed 07-Oct-14 17:54)  Authored: General Aspect/Present Illness, History and Physical Exam, Review of System, Labs, Radiology, Allergies, Vital Signs/Nurse's Notes, Impression/Plan   Last Updated: 07-Oct-14 17:54 by Roderic Palau (NP)

## 2014-10-02 NOTE — H&P (Signed)
PATIENT NAME:  Clifford Porter, CARDINAL MR#:  [id number] DATE OF BIRTH:  14-Jan-1912  DATE OF ADMISSION:  03/18/2013  PRIMARY CARE PHYSICIAN: Aram Beecham.   HISTORY OF PRESENT ILLNESS: The patient is a 81 year old Caucasian male with past medical history significant for history of constipation, glaucoma, dementia, BPH, chronic pain syndrome due to back pain, hypothyroidism and A-fib who presents to the hospital with complaints of syncopal episodes. Apparently, the patient was found while he was sitting in the bed unresponsive or poorly responsive. He also urinated on himself and was not aware about that. He lives in an assisted living facility. Apparently, assistant came to his room to help him to dress and he was found to be slumped over, unresponsive or poorly responsive. He was able to initially say hello, but then kept repeating the word yes and later on his mental status normalized. He was complaining of some shortness of breath and now upon questioning he also admits of having some chest pains, but not able to provide much more history. According to the patient's family, he seemed to be dehydrated as he is not able to eat or drink plenty. He is not losing weight according to family members. The patient has been having problems with shortness of breath on and off for several days now. While in the hospital, in the Emergency Room, he was also complaining of left hand pains. He was also telling family that he was somewhat lightheaded in the morning. He was found to have elevated troponin and significant anemia. His hemoglobin level dropped, just down, by 4 grams since summer. Hospitalist services were contacted for admission.   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  ALLERGIES: I am not able to get.  PAST SURGICAL HISTORY: I am not able to get.  FAMILY HISTORY: I am not able to get.  SOCIAL HISTORY: I am not able to get. I know that he lives in an assisted living facility. He does not drink or never drank and does not smoke or never smoked.  REVIEW OF SYSTEMS: Positive for pains in his chest, questionable pain, shortness of breath on and off, unclear how long, macular degeneration and the patient is almost blind. Admits of having some phlegm production, sinus congestion. Admits of having some cough with amber-colored phlegm and shortness of breath. Questionable chest pains. Dyspnea on exertion. Also feeling lightheaded and dizzy and weak earlier today. Poor appetite yesterday. Poor fluid intake. In general frequent constipation. Last bowel movement was approximately 2 days ago. The patient admits of having decreased urinary frequency. He did not go to the bathroom earlier today and had to be catheterized from his bladder here in the Emergency Room. Admits of having some right arm numbness intermittently, also left hand pain while in the Emergency Room.  CONSTITUTIONAL: Otherwise, denies fevers, chills, fatigue, weakness, weight loss or gain.  EYES: Denies any blurry vision, double vision, glaucoma or cataracts.  EARS, NOSE, THROAT: Denies tinnitus, allergies, epistaxis, sinus pain, dentures or difficulty swallowing.  RESPIRATORY: Denies any wheezing, asthma or COPD. CARDIOVASCULAR: Denies any orthopnea, edema, arrhythmias, palpitations or syncope.  GASTROINTESTINAL: Denies nausea, vomiting, diarrhea, rectal bleeding, change in bowel habits.  GENITOURINARY: Denies dysuria, hematuria, frequency or incontinence.  ENDOCRINE: Denies any polydipsia, nocturia, thyroid problems, heat or cold intolerance or thirst. HEMATOLOGIC: Denies  anemia, easy bruising, bleeding or swollen glands.  SKIN: Denies any acne, rashes or change in moles.  MUSCULOSKELETAL: Denies arthritis, cramps, swelling, gout.  NEUROLOGIC: Nausea numbness, epilepsy or tremor. PSYCHIATRIC: Denies anxiety, insomnia or depression.   PHYSICAL EXAMINATION: GENERAL: Well-developed, well-nourished, pale Caucasian male lying on the stretcher.  HEENT: Pupils are equal and reactive to light. Extraocular movements intact. No icterus or conjunctivitis. Has difficulty hearing. No pharyngeal erythema. Mucosa is very dry.  NECK: No mass. Supple and nontender. Thyroid is not enlarged. No adenopathy. No JVD, however, the patient has carotid bruit on the left. Full range of motion. LUNGS: Clear to auscultation in all fields. Somewhat diminished breath sounds, otherwise, no rhonchi or wheezing. No labored inspirations, increased effort, dullness to percussion, overt respiratory distress.  CARDIOVASCULAR: S1 and S2 appreciated. Systolic murmur was heard, 4/6, in all  precordium radiating to his left neck as well as axilla. Chest is nontender to palpation. Diminished pedal pulses.  EXTREMITIES: No lower extremity edema, calf tenderness or cyanosis was noted.  ABDOMEN: Soft, nontender. Bowel sounds are present. No hepatosplenomegaly or masses were noted. RECTAL: Done by Emergency Room physician, was guaiac-positive.  MUSCLE STRENGTH: Able to move all extremities. Weakness in the lower extremities was noted as well as upper extremity but lower extremity weakness seemed to be more pronounced. No cyanosis. The patient had difficulty sitting up in the bed. He has mild kyphosis. Gait is not tested.  SKIN: Did not reveal any rashes, lesions, erythema, nodularity or induration. It was warm and dry to palpation.  LYMPHATIC: No adenopathy in the cervical region.  NEUROLOGICAL: Cranial nerves grossly intact. Sensory is intact. No dysarthria or aphasia. The patient is alert, oriented to person  and place. He is cooperative; however, his memory is impaired. He is confused and he is talking about nonessential things and very difficult to get history from him.   LABORATORY AND DIAGNOSTICS: EKG reveals A-fib, rate of 83 beats per minute, normal axis, T depressions in lateral leads which are new since prior EKG done in March 2014.  BMP revealed elevation of BUN and creatinine to 28 and 1.59, glucose 110, bicarbonate level is normal at 25. Estimated GFR for non-African American would be 35. Liver enzymes are completely within normal limits, except for albumin level of 6.3. Cardiac enzymes showed elevation of troponin to 0.06. TSH normal at 4.42. Digoxin level is 0.9. Postle blood cell count is elevated to 11, hemoglobin 7.8 as compared to 11.9 in June 2014, and platelet count was 331. Urinalysis: Yellow hazy urine, negative for glucose or bilirubin. Trace ketones were noted. Specific gravity 1.023, pH was 5.0, negative for blood, 100 mg/dL protein, negative for nitrites or leukocyte esterase, 1 red blood cell, 1 Hauschild blood cell, no bacteria was seen, less than 1 epithelial cell, mucus was present as well as 17 hyaline casts.   Chest x-ray, PA and lateral, 7th of October 2014, revealed underlying mild cardiomegaly and COPD and possibly some underlying fibrosis or mild interstitial edema, according to radiology. CT scan of head without contrast, 7th of October 2014, reveals no acute intracranial process.   ASSESSMENT AND PLAN: 1.  Syncope. Admit the patient to the medical floor, off unit telemetry. 2.  Questionable orthostatic hypotension in view that the patient is dehydrated as well as anemic, but cannot rule out acute coronary syndrome. We will  admit the patient to the medical floor. As mentioned above, we will get orthostatic vital signs, will continue IV fluids, will get carotid ultrasound and echocardiogram as well as cardiology consultation. 3.  Elevated troponin with dyspnea. Will follow cardiac  enzymes. We will start the patient on metoprolol. Unable to use aspirin or Lovenox or heparin due to anemia, unfortunately. Will follow cardiac enzymes as well as getting echocardiogram and cardiology consultation.  4.  Renal insufficiency, likely dehydration. We will continue IV fluids. Urinalysis is unremarkable. We will get also bladder scan due to history of benign prostatic hypertrophy. 5.  Atrial fibrillation, rate controlled. Digoxin level is normal. Not on any anticoagulation and anticoagulation will not be initiated at this time due to gastrointestinal bleed.  6.  Anemia, guaiac-positive, questionable acute on chronic gastrointestinal bleed. Follow hemoglobins, transfuse as needed. Will continue proton pump inhibitors IV twice daily. We will get gastroenterology consultation and will make decisions about therapies and evaluation.  7.  Dementia. Supportive therapy.  8.  Constipation. We will continue home medications.  9.  Suspected peripheral vascular disease. The patient may benefit from vascular evaluation. Follow extremity weakness. Will get physical therapist involved. 10.  Hypothyroidism. We will continue Synthroid. The patient's TSH is therapeutic.   TIME SPENT: 50 minutes.  ____________________________ Katharina Caperima Marsalis Beaulieu, MD rv:sb D: 03/18/2013 15:36:51 ET T: 03/18/2013 16:13:28 ET JOB#: [id number]  cc: Katharina Caperima Anacaren Kohan, MD, <Dictator> Duane LopeJeffrey D. Judithann SheenSparks, MD Katharina CaperIMA Geovanni Rahming MD ELECTRONICALLY SIGNED 03/25/2013 12:33

## 2014-10-02 NOTE — Consult Note (Signed)
   Comments   I spoke at length with pt's daughter, Floreen Comberileen Simmons. Updated her on pt's medical condition. Told her of pt's reluctance to undergo aggressive testing. Awaiting GI consult. I also talked to pt and daughter about his potential need for a higher level of care at discharge. SW has been consulted to assist with this.   Electronic Signatures: Subrena Devereux, Harriett SineNancy (MD)  (Signed 08-Oct-14 10:25)  Authored: Palliative Care   Last Updated: 08-Oct-14 10:25 by Bodey Frizell, Harriett SineNancy (MD)

## 2014-10-02 NOTE — Discharge Summary (Signed)
PATIENT NAME:  Clifford Porter, Clifford Porter MR#:  [id number] DATE OF BIRTH:  [birth year]  DATE OF ADMISSION:  03/18/2013 DATE OF DISCHARGE: 03/20/2013  TYPE OF DISCHARGE: The patient is transferred to a skilled nursing facility.   REASON FOR ADMISSION: Syncope.   HISTORY OF PRESENT ILLNESS: The patient is a 81 year old male with a significant history of chronic atrial fibrillation, trigeminal nerve neuropathy, dementia, and BPH, who was living in assisted living. He was found sitting in bed, unresponsive. He had urinated on himself. He was brought to the Emergency Room where he was found to be profoundly anemic with guaiac-positive stools and was admitted for further evaluation. Upon admission his troponin was mildly elevated as well.   PAST MEDICAL HISTORY: 1. Chronic atrial fibrillation.  2. Senile dementia.  3. Trigeminal nerve neuropathy.  4. BPH.  5. Glaucoma.  6. Chronic constipation.  7. Chronic pain.  8. Degenerative disk disease.  9. Hypothyroidism.  10. Peripheral vascular disease.   MEDICATIONS ON ADMISSION: Please see admission note.   ALLERGIES: TEGRETOL.   SOCIAL HISTORY, FAMILY HISTORY, REVIEW OF SYSTEMS: As per admission note.   PHYSICAL EXAM: GENERAL: The patient was in no acute distress.   VITAL SIGNS: Stable and he was afebrile.  HEENT: Exam was unremarkable.  NECK: Was supple without JVD. Carotid bruit on the left was noted.  LUNGS: Clear.  CARDIAC: Examination revealed an irregularly irregular rhythm with a 2/6 systolic murmur.  ABDOMEN: Soft and nontender.  EXTREMITIES: Without edema.  NEUROLOGIC: Exam was grossly nonfocal.   HOSPITAL COURSE: The patient was admitted with hypovolemic syncope due to GI bleed and anemia. His troponin was mildly elevated. He was seen in consultation by cardiology, who felt that the troponin elevation was due to demand ischemia. He was seen by GI, who recommended conservative therapy. He was transfused 2 units of packed red blood cells with  improvement of his blood pressure and hemoglobin. He was asymptomatic. His confusion remained stable. By 03/20/2013, the patient was stable and ready for discharge.   DISCHARGE DIAGNOSES: 1. Hypovolemic syncope.  2. Gastrointestinal bleed.  3. Anemia due to acute blood loss.  4. Chronic atrial fibrillation.  5. Senile dementia.  6. Peripheral vascular disease.  7. Benign prostatic hypertrophy.  8. Chronic constipation.  9. Hypothyroidism.   DISCHARGE MEDICATIONS: 1. Tylenol 650 mg p.o. q.6h. p.r.n. pain and fever.  2. Colace 100 mg p.o. b.i.d.  3. Atrovent nasal spray 2 puffs each nostril b.i.d.  4. Latanoprost eye drops one drop in each eye at bedtime.  5. Synthroid 75 mcg p.o. daily.  6. Nitro-Dur 0.1 mg topically daily, off at bedtime.  7. Zofran 4 mg p.o. q.4 hours p.r.n. nausea and vomiting.  8. Senna 1 p.o. b.i.d.  9. Flomax 0.4 mg p.o. daily.  10. Norco 5/325  1 to 2 p.o. q.4h. p.r.n. pain.  11. Iron sulfate 325 mg p.o. b.i.d.  12. Protonix 40 mg p.o. b.i.d.   FOLLOW-UP PLANS AND APPOINTMENTS: The patient will be followed by the resident physician at the skilled nursing facility. He is on a mechanical soft 2 grams sodium diet. CBC and a Met b  in one week. He will be seen in consultation by physical therapy.   ____________________________ Leonie Douglas. Doy Hutching, MD jds:sg D: 03/20/2013 12:04:27 ET T: 03/20/2013 12:31:38 ET JOB#: [id number]  cc: Leonie Douglas. Doy Hutching, MD, <Dictator> Amand Lemoine Lennice Sites MD ELECTRONICALLY SIGNED 03/20/2013 13:11

## 2014-10-02 NOTE — Consult Note (Signed)
PATIENT NAME:  Clifford Porter, Clifford Porter MR#:  [id number] DATE OF BIRTH:  [birth year]  DATE OF CONSULTATION:  03/19/2013  REFERRING PHYSICIAN:  Duane Lope. Judithann Sheen, MD CONSULTING PHYSICIAN:  Dow Adolph, MD  REASON FOR THE CONSULT: Anemia, positive FOBT.   HISTORY OF PRESENT ILLNESS: Clifford Porter is a 81 year old male with a past medical history notable for atrial fibrillation, dementia, chronic pain syndrome, who was brought to the hospital for an episode of syncope and unresponsiveness at his skilled nursing facility. GI is consulted for evaluation of anemia and positive FOBT. [name] denies seeing any blood in his bowel movements or any black bowel movements. He is unsure when he has had his last colonoscopy. He is not aware of ever being told that he has trouble with low blood counts.   His hospital course has been notable for an elevation in his troponins. He also on presentation was noted to be anemic. He had stool guaiac testing done that was positive for occult blood.   PAST MEDICAL HISTORY: 1.  A. fib.  2.  Constipation.  3.  Glaucoma.  4.  Dementia.  5.  BPH.  6.  Chronic pain syndrome due to herniated disk. 7.  Hypothyroidism.  8.  Peripheral vascular disease.   ALLERGIES: Unknown.   PAST SURGICAL HISTORY: Unknown.   FAMILY HISTORY: Unknown, but he denies any family history of colon cancer.   SOCIAL HISTORY: He lives in a skilled nursing facility. He denies any alcohol or tobacco.   REVIEW OF SYSTEMS: A 10-system review was conducted. It is negative except as stated in the HPI. In addition, he does complain of some pain in his left foot.   PHYSICAL EXAMINATION:  VITAL SIGNS: Temperature is 97.3. Pulse is 74. Respirations are 18. Blood pressure 146/84. He is 98% on 2 liters of O2.  GENERAL: Alert and oriented x 2.  No acute distress. Appears younger than stated age. HEENT: Normocephalic/atraumatic. Extraocular movements are intact. Anicteric. NECK: Soft, supple. JVP appears normal. No  adenopathy. CHEST: Clear to auscultation. No wheeze or crackle. Respirations unlabored. HEART: Regular. No murmur, rub, or gallop.  Normal S1 and S2. ABDOMEN: Soft, nontender, nondistended.  Normal active bowel sounds in all four quadrants.  No organomegaly. No masses EXTREMITIES: No swelling, well perfused. SKIN: No rash or lesion. Skin color, texture, turgor normal. NEUROLOGICAL: Grossly intact. PSYCHIATRIC: Normal tone and affect. MUSCULOSKELETAL: No joint swelling or erythema.   LABORATORY DATA: Sodium is 137, potassium 4.5, chloride 106, bicarbonate 25, BUN 30, creatinine 1.37, total protein 5.6, albumin 3.2, alkaline phosphatase 76, AST 32, ALT 28. Dewberry count is 7.4; hemoglobin is 7.3, hematocrit 21; platelets are 286. His PTT is 37.6.   ASSESSMENT AND PLAN: Anemia, fecal occult blood test: His blood counts seem to be stable here in the hospital, although they are low. It does not seem as though he is having any overt gastrointestinal bleeding. He also is hemodynamically stable.   Given his significant advanced age, comorbidities and decreased mental status, I suspect the risk of any endoscopic procedures would outweigh the benefit. It is unlikely that we would find an intervenable lesion on endoscopy. Therefore, I would recommend against endoscopy at this time. I do agree with transfusions as necessary. Also, oral iron therapy will hopefully help to maintain his hemoglobins at an acceptable level. I do agree with a palliative care consult given his advanced age.   We will continue to monitor his hemoglobins. No additional recommendations at this time.   Thank  you for this consult.   ____________________________ Dow AdolphMatthew Justan Gaede, MD mr:jm D: 03/19/2013 17:50:26 ET T: 03/19/2013 19:13:12 ET JOB#: [id number]  cc: Dow AdolphMatthew Kaelyn Nauta, MD, <Dictator> Kathalene FramesMATTHEW G Shemekia Patane MD ELECTRONICALLY SIGNED 04/10/2013 16:24

## 2014-10-02 NOTE — Consult Note (Signed)
   Comments   I spoke with Johnnye SimaStuart Cox, CS[facility] at [facility]. Informed him that pt would likely be ready for discharge in AM and would need a higher level of care than ALF. He says that [facility]. I discussed with pt and with daughter, Floreen Comberileen Simmons. Both are in agreement with this plan.   Electronic Signatures: Truett Mcfarlan, Harriett SineNancy (MD)  (Signed 08-Oct-14 16:12)  Authored: Palliative Care   Last Updated: 08-Oct-14 16:12 by Mathayus Stanbery, Harriett SineNancy (MD)

## 2014-10-03 NOTE — Discharge Summary (Signed)
PATIENT NAME:  Clifford Porter, Clifford Porter DATE OF BIRTH:  1912-05-04  DATE OF ADMISSION:  12/01/2013 DATE OF DISCHARGE:  12/02/2013  DISCHARGE DIAGNOSES: 1. Fall and C6-7 ligamentous injury. 2. Chronic cough.  3. Severe Aortic Stenosis , baseline.   DISCHARGE MEDICATIONS: Per [facility] med reconciliation summary. Will be on his usual medications plus Advair 1 puff twice a day.   HISTORY AND PHYSICAL: Please see detailed history and physical done on admission.   HOSPITAL COURSE: The patient admitted with a fall. Initial CT was negative for fractures of his head and C-spine. He then had an MRI of his C-spine and he was found to have the injury noted above. Discussed this with neurosurgery and tentatively [facility]. They will need to follow him up as an outpatient and determine further length of time, but at least two weeks at this point prior to his follow-up. He has analgesia as needed for that.   TIME SPENT: It took approximately 35 minutes to coordinate all these discharge plans today and we will  need to talk further with the family when they are  available.   ____________________________ Marya AmslerMarshall Porter. Dareen PianoAnderson, MD mwa:sg D: 12/02/2013 08:19:16 ET T: 12/02/2013 08:31:46 ET JOB#: [id number]  cc: Marya AmslerMarshall Porter. Dareen PianoAnderson, MD, <Dictator> Lauro RegulusMARSHALL Porter ANDERSON MD ELECTRONICALLY SIGNED 12/02/2013 13:30

## 2014-10-03 NOTE — H&P (Signed)
PATIENT NAME:  Clifford Porter, Clifford Porter MR#:  [id number] DATE OF BIRTH:  Apr 06, 1912  DATE OF ADMISSION:  12/01/2013  PRIMARY CARE PHYSICIAN: Dr. Georgie Chard  REFERRING PHYSICIAN: Dr. Jacqualine Code  CHIEF COMPLAINT: Unwitnessed fall.   HISTORY OF PRESENT ILLNESS: The patient is a 81 year old Caucasian male with DNR code status who is sent over to the ED after he sustained an unwitnessed fall at a nursing home. He is complaining of severe neck pain. He was placed in a cervical collar and CT of the head and a CT of the cervical spine were done. CT of the head did not reveal any acute changes and  cervical spine has revealed no acute fractures, but as the patient was complaining of severe neck pain, MRI of the C-spine was ordered stat and hospitalist team is called to admit the patient. The patient has some degree of dementia and not great historian. He is just complaining of neck pain and asking [place] to get imaging studies done as soon as possible. Son, who has accompanied the patient, left the hospital prior to my arrival. The patient was placed on 2 liters of oxygen and morphine was given for pain control.    PAST MEDICAL HISTORY: Chronic atrial fibrillation, hypothyroidism, chronic dementia, glaucoma, constipation, dry eyes, benign prostatic hypertrophy, trigeminal neuropathy treated with gamma ray, chronic pain syndrome due to herniated disk in the lower back.   PAST SURGICAL HISTORY: Unobtainable.   ALLERGIES: TEGRETOL.   PSYCHOSOCIAL HISTORY: Lives in a nursing home, at an assisted living facility. No history of smoking, alcohol or illicit drug use, regarding to the old records.   FAMILY HISTORY: Unobtainable.  REVIEW OF SYSTEMS: Unobtainable, but the patient is complaining of neck pain persistently and asking for pain medicine.   HOME MEDICATIONS: Voltaren topical gel to massage to the foot, tamsulosin 0.4 mg p.o. once daily, polyethylene glycol 17 grams p.o. once daily,   nitroglycerin 0.4 mg topically once a  day,levothyroxine 75 mcg once daily, melatonin 3 mg 1 tablet p.o. once a day, lactulose 30 mL p.o. once a day, ipratropium nasal spray 2 sprays 2 times a day, Tylenol 1 tablet p.o. every 4 hours as needed.  PHYSICAL EXAMINATION: VITAL SIGNS: Temperature 96.9, pulse 91, respirations 20, blood pressure 156/74, pulse ox 96%.  GENERAL APPEARANCE: Not in any acute distress, moderately built and nourished.  HEENT: Normocephalic. Complaining of neck pain following unwitnessed fall and wearing cervical collar. Pupils are equally reacting to light and accommodation. No scleral icterus. No conjunctival injection. No sinus tenderness.  NECK: I cannot examine the neck as the patient is complaining of neck pain and wearing cervical collar. LUNGS: Clear to auscultation bilaterally. No accessory muscle usage. No anterior chest wall tenderness on palpation.  CARDIOVASCULAR: Irregularly irregular, positive murmur.  ABDOMEN: Soft. Bowel sounds are positive in all 4 quadrants. Nontender, nondistended. No masses felt. NEUROLOGIC: Awake, alert, and oriented to place and person, not oriented to time. Hard of hearing. Reflexes are 2+. Spontaneously moving his extremities. Sensory is intact.  MUSCULOSKELETAL: Complaining of neck pain. Wearing cervical collar. No joint effusion, tenderness.  PSYCHIATRIC: Mood and affect could not be elicited as the patient is a poor historian.  DIAGNOSTIC DATA: CT of the head and cervical spine without contrast: Scalp hematoma along right parietal scalp. Chronic maxillary and ethmoid sinusitis. Mild acute right sphenoid sinusitis. Extensive cervical spondylosis. No acute fracture or subluxation is evident. Bilateral carotid artery atherosclerotic calcifications. Bilateral pleural effusions with interstitial edema in the lung bases.  CT of the thoracic spine without contrast: Mild chronic appearing T2 compression fracture. Included view of the chest demonstrates small to moderate right  pleural effusion, trace to the left and interlobular septal thickening and bronchial wall thickening suggesting pulmonary edema or bronchitis.   MRI of the C-spine is ordered, which is pending at this time.  Twelve-lead EKG: Atrial fibrillation at 91, ventricular rate.   Troponin less than 0.02. WBC 7.6, hemoglobin 11.1, hematocrit 33.6, platelets 271,000. Glucose 114, BUN and creatinine are normal. Sodium and potassium are normal. Chloride and CO2 are normal. Anion gap 5. EGFR 58. Serum osmolality and calcium are normal.   ASSESSMENT AND PLAN: A 89-year-old patient who is residing in an assisted living facility, was brought into the ED after he had an unwitnessed fall, complaining of neck pain. Cervical collar is placed.  1.  Unwitnessed fall secondary to balance problems, as reported by the patient. Physical therapy consult is placed for gait evaluation. Out of bed with assistance. Fall precautions.  2.  Severe neck pain following unwitnessed fall. Probably from severe degenerative joint disease and fall. CT of the C-spine did not reveal any acute changes. MRI of the C-spine is ordered stat, which is pending. Pain management will be provided as needed.  3.  Chronic atrial fibrillation, rate controlled. Not on any anticoagulation.  4.  Hypothyroidism. Continue Synthroid.  5.  We will provide gastrointestinal and deep vein thrombosis prophylaxis.   He is DO NOT RESUSCITATE. Son is the medical POA. The patient will be transferred to Dr. Jeff Sparks in the a.m.   TOTAL TIME SPENT ON ADMISSION: 50 minutes.  ____________________________ Aruna Gouru, MD ag:sb D: 12/01/2013 07:58:26 ET T: 12/01/2013 08:26:27 ET JOB#: [id number]  cc: Aruna Gouru, MD, <Dictator> ARUNA GOURU MD ELECTRONICALLY SIGNED 12/03/2013 7:44 

## 2014-11-11 ENCOUNTER — Encounter
Admission: RE | Admit: 2014-11-11 | Discharge: 2014-11-11 | Disposition: A | Discharge status: Home / Self Care | Payer: Medicare Other | Source: Ambulatory Visit | Attending: Internal Medicine | Admitting: Internal Medicine

## 2014-12-11 ENCOUNTER — Encounter
Admission: RE | Admit: 2014-12-11 | Discharge: 2014-12-11 | Disposition: A | Discharge status: Home / Self Care | Payer: Medicare Other | Source: Ambulatory Visit | Attending: Internal Medicine | Admitting: Internal Medicine

## 2014-12-18 IMAGING — CT CT CERVICAL SPINE WITHOUT CONTRAST
3 of 4 series · 11 of 27 positions shown, 13 images · non-contrast
Comparison: 12/01/2013

CLINICAL DATA: Fall.

EXAM:
CT HEAD WITHOUT CONTRAST
CT CERVICAL SPINE WITHOUT CONTRAST
TECHNIQUE: Multidetector CT imaging of the head and cervical spine was
performed following the standard protocol without intravenous
contrast. Multiplanar CT image reconstructions of the cervical spine
were also generated.

[Series 3: head bone · axial · 0.42mm/px · z∈[-151,-97]mm · 3 of 108 slices shown]
[im 18/108  bone]
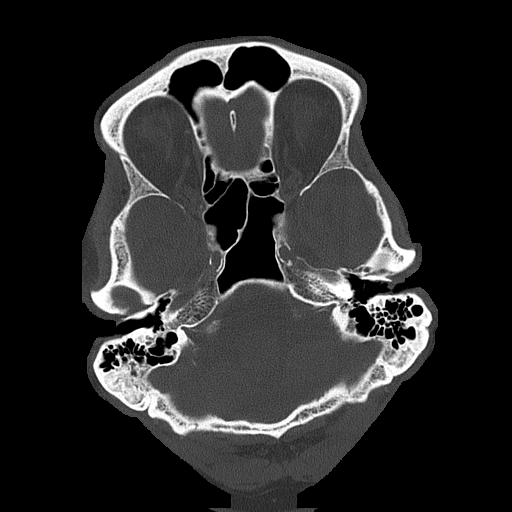
[im 36/108  bone]
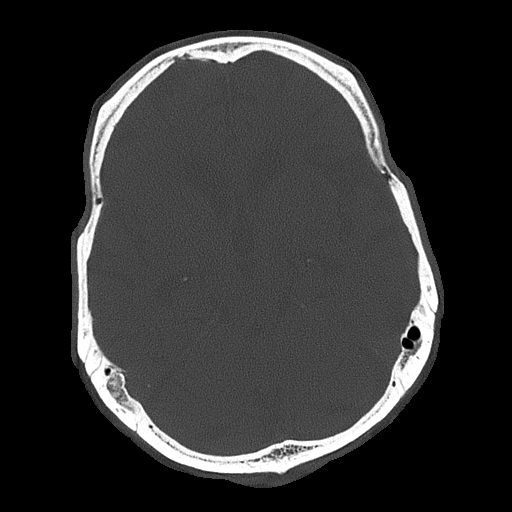
[im 54/108  bone]
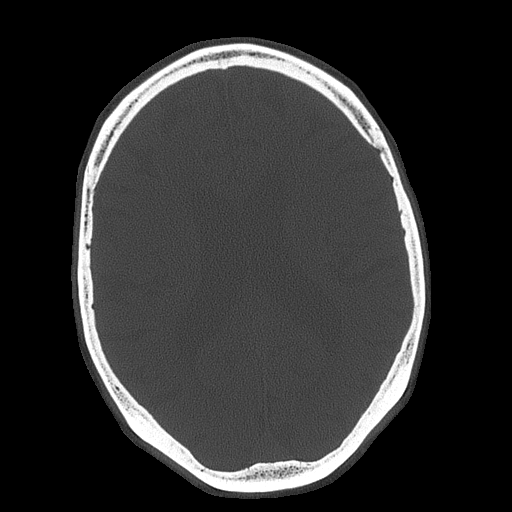

[Series 5: c spine soft · axial · 0.52mm/px · z∈[-262,-182]mm · 3 of 80 slices shown, 4 images]
[im 20/80  soft-tissue]
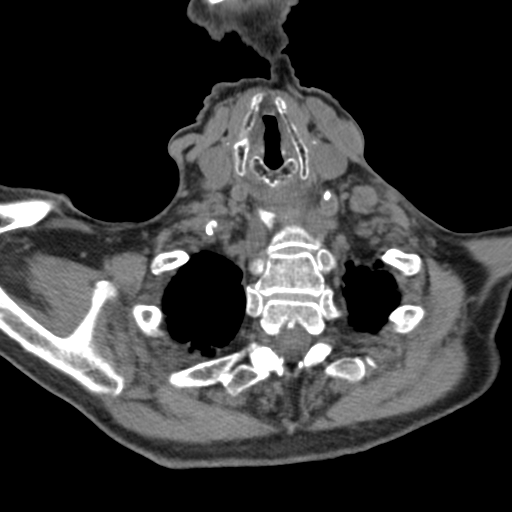
[im 20/80  bone]
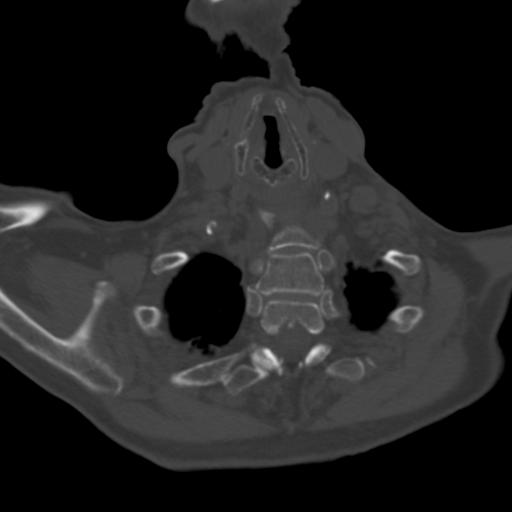
[im 40/80  bone]
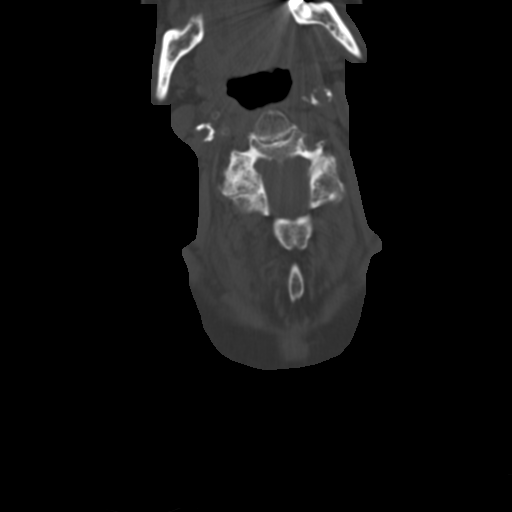
[im 60/80  bone]
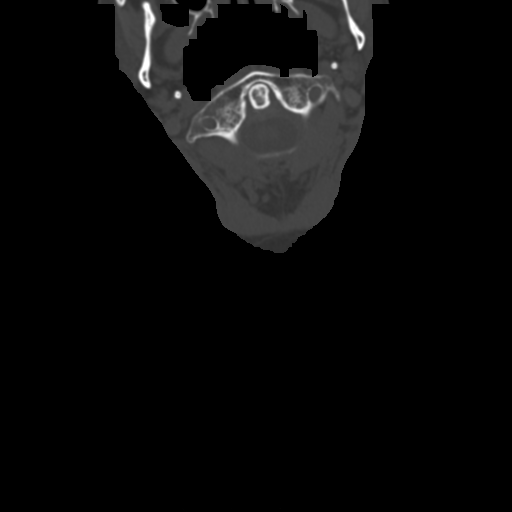

[Series 8: sag bone · sagittal · 0.23mm/px · 5 of 52 slices shown, 6 images]
[im 18/52  bone]
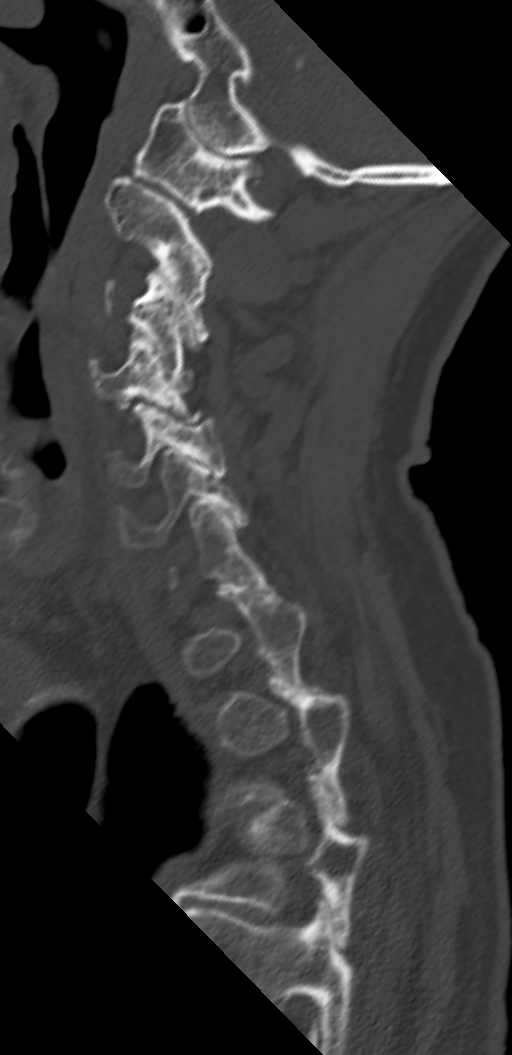
[im 22/52  bone]
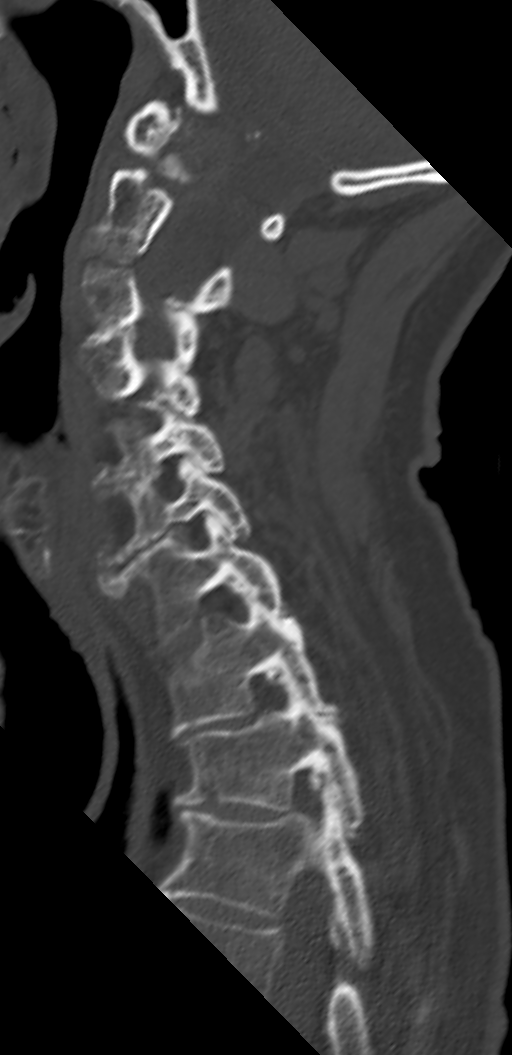
[im 26/52  soft-tissue]
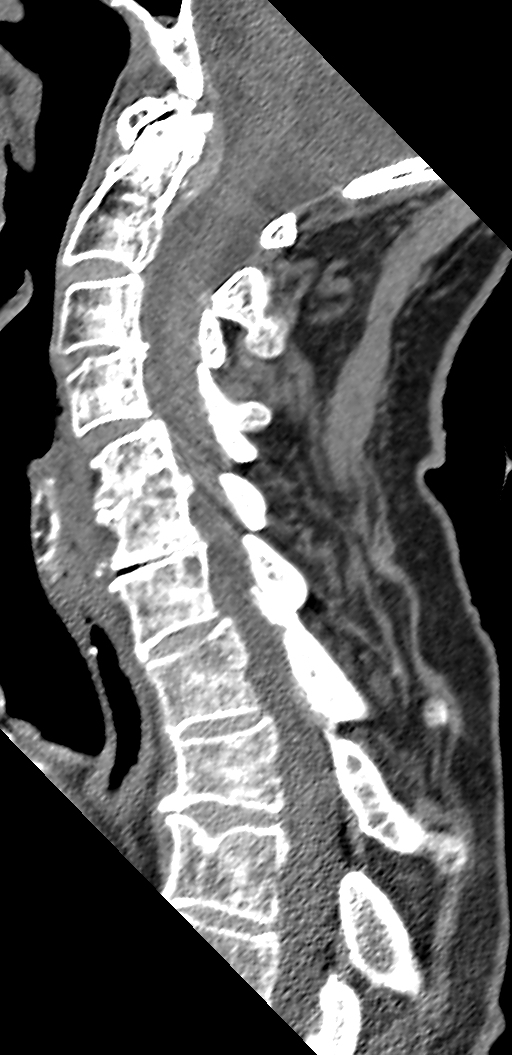
[im 26/52  bone]
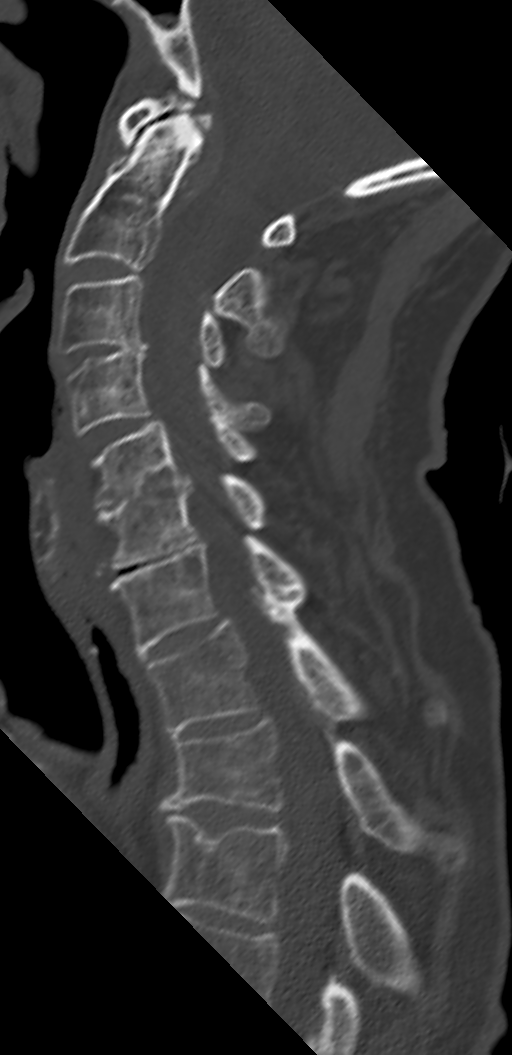
[im 30/52  bone]
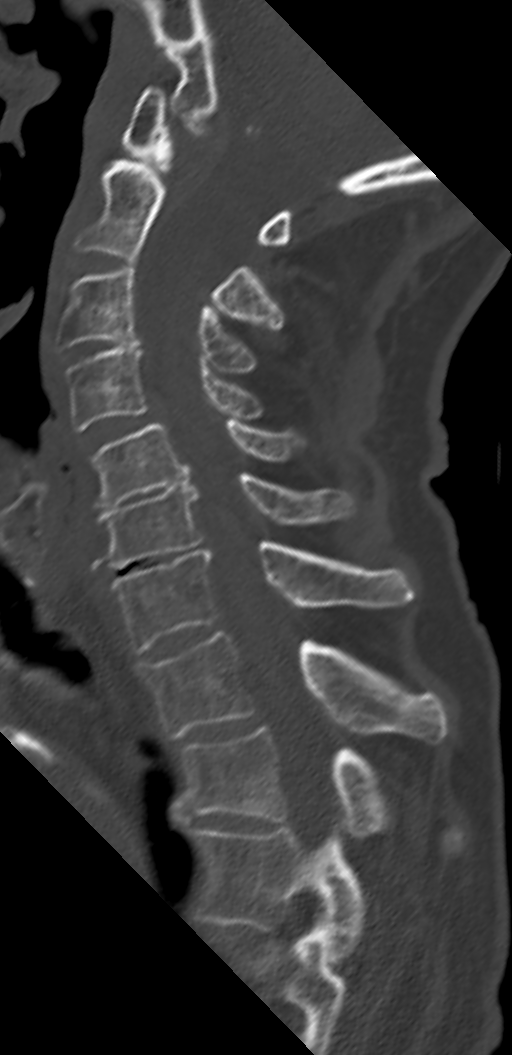
[im 35/52  bone]
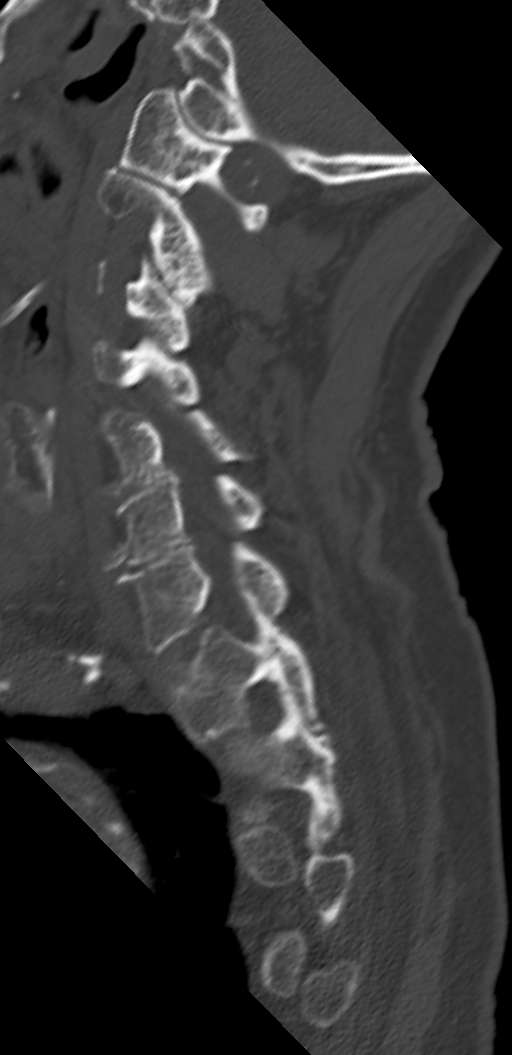

[11 of 27 positions shown; findings below may reference images not displayed]

FINDINGS: CT HEAD FINDINGS

Skull and Sinuses:There is a high right parietal scalp hematoma and
laceration. No underlying fracture. There is retention of secretions
within the left and right sphenoid sinuses. Mild ethmoid
inflammatory mucosal thickening bilaterally.

Orbits: Bilateral cataract resection.

Brain: No evidence of acute abnormality, such as acute infarction,
hemorrhage, hydrocephalus, or mass lesion/mass effect. There is
generalized cerebral volume loss which is age commensurate. Chronic
small-vessel disease with patchy deep and periventricular ischemic
gliosis. Remote cortical infarct in the posterior, superficial left
temporal lobe.

CT CERVICAL SPINE FINDINGS

There is a fracture through the anterior and inferior corner of the
C6 vertebral body, likely occurring December 01, 2013 when correlated
with subsequent MRI. There is no subluxation at this level. Anterior
disc widening has reduced.

There is no acute fracture suspected. Chronic C4-5 anterolisthesis
which is associated with advanced facet osteoarthritis. Mild
compression deformity of the T2 body and the T3 superior endplate is
chronic.

Degenerative disc disease is diffuse, with narrowing most advanced
at C3-4 and C5-6. There is disc ankylosis at the lower level.
Diffuse uncovertebral and facet spurring causing foraminal crowding.
IMPRESSION: 1. Right parietal scalp contusion. No calvarial fracture or
intracranial injury.
2. No acute cervical spine fracture or malalignment.
3. Subacute C6 anteroinferior corner fracture. No interval
displacement.

## 2014-12-18 IMAGING — CR PELVIS - 1-2 VIEW
1 series · 1 of 1 positions shown · non-contrast
Comparison: 09/08/2012

CLINICAL DATA: Pain after fall

EXAM:
PELVIS - 1-2 VIEW

[t pelvis ap]
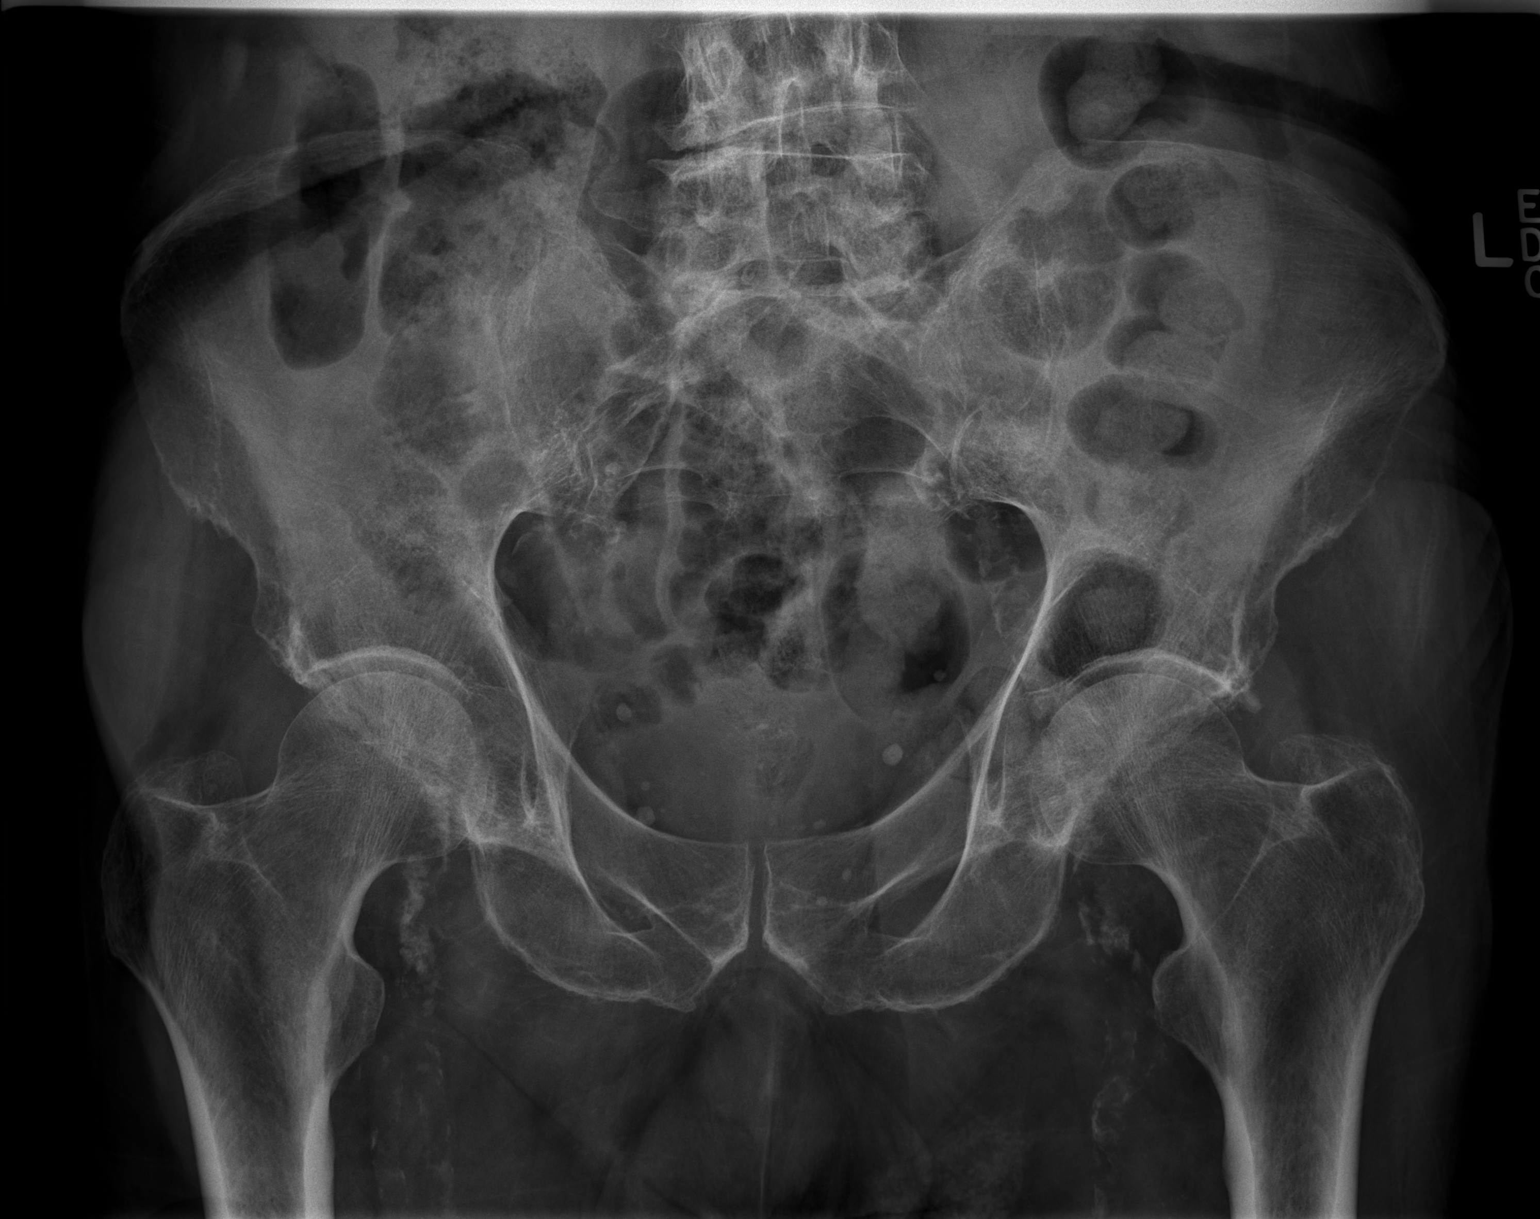

[1 of 1 positions shown; findings below may reference images not displayed]

FINDINGS: There is mild projectional foreshortening of the femoral necks. No
acute fracture is suspected. No hip dislocation. No evidence of
pelvic ring fracture. Diffuse arterial calcification.
IMPRESSION: No acute osseous findings. If pain lateralizes, suggest dedicated
hip imaging.

## 2015-01-11 ENCOUNTER — Encounter
Admission: RE | Admit: 2015-01-11 | Discharge: 2015-01-11 | Disposition: A | Discharge status: Home / Self Care | Payer: Medicare Other | Source: Ambulatory Visit | Attending: Internal Medicine | Admitting: Internal Medicine

## 2015-02-11 ENCOUNTER — Encounter
Admission: RE | Admit: 2015-02-11 | Discharge: 2015-02-11 | Disposition: A | Discharge status: Home / Self Care | Payer: Medicare Other | Source: Ambulatory Visit | Attending: Internal Medicine | Admitting: Internal Medicine

## 2015-04-13 ENCOUNTER — Encounter
Admission: RE | Admit: 2015-04-13 | Discharge: 2015-04-13 | Disposition: A | Discharge status: Home / Self Care | Payer: Medicare Other | Source: Ambulatory Visit | Attending: Internal Medicine | Admitting: Internal Medicine

## 2015-05-13 ENCOUNTER — Encounter
Admission: RE | Admit: 2015-05-13 | Discharge: 2015-05-13 | Disposition: A | Discharge status: Home / Self Care | Payer: Medicare Other | Source: Ambulatory Visit | Attending: Internal Medicine | Admitting: Internal Medicine

## 2015-06-13 ENCOUNTER — Encounter: Admission: RE | Admit: 2015-06-13 | Payer: Medicare Other | Source: Ambulatory Visit | Admitting: Internal Medicine

## 2015-07-07 ENCOUNTER — Encounter
Admission: RE | Admit: 2015-07-07 | Discharge: 2015-07-07 | Disposition: A | Discharge status: Home / Self Care | Payer: Medicare Other | Source: Ambulatory Visit | Attending: Internal Medicine | Admitting: Internal Medicine

## 2015-07-14 ENCOUNTER — Encounter
Admission: RE | Admit: 2015-07-14 | Discharge: 2015-07-14 | Disposition: A | Discharge status: Home / Self Care | Payer: Medicare Other | Source: Ambulatory Visit | Attending: Internal Medicine | Admitting: Internal Medicine

## 2015-08-11 ENCOUNTER — Encounter
Admission: RE | Admit: 2015-08-11 | Discharge: 2015-08-11 | Disposition: A | Discharge status: Home / Self Care | Payer: Medicare Other | Source: Ambulatory Visit | Attending: Internal Medicine | Admitting: Internal Medicine

## 2015-09-11 ENCOUNTER — Encounter
Admission: RE | Admit: 2015-09-11 | Discharge: 2015-09-11 | Disposition: A | Discharge status: Home / Self Care | Payer: Medicare Other | Source: Ambulatory Visit | Attending: Internal Medicine | Admitting: Internal Medicine

## 2015-09-11 DIAGNOSIS — I4891 Unspecified atrial fibrillation: Secondary | ICD-10-CM | POA: Insufficient documentation

## 2015-09-20 DIAGNOSIS — I4891 Unspecified atrial fibrillation: Secondary | ICD-10-CM | POA: Diagnosis not present

## 2015-09-20 LAB — CBC WITH DIFFERENTIAL/PLATELET
BASOS ABS: 0.1 10*3/uL (ref 0–0.1)
Basophils Relative: 1 %
Eosinophils Absolute: 0.1 10*3/uL (ref 0–0.7)
Eosinophils Relative: 2 %
HEMATOCRIT: 35.4 % — AB (ref 40.0–52.0)
Hemoglobin: 11.4 g/dL — ABNORMAL LOW (ref 13.0–18.0)
Lymphs Abs: 1.5 10*3/uL (ref 1.0–3.6)
MCH: 28.6 pg (ref 26.0–34.0)
MCHC: 32.3 g/dL (ref 32.0–36.0)
MCV: 88.5 fL (ref 80.0–100.0)
MONO ABS: 1 10*3/uL (ref 0.2–1.0)
Monocytes Relative: 21 %
NEUTROS ABS: 2 10*3/uL (ref 1.4–6.5)
Neutrophils Relative %: 43 %
Platelets: 83 10*3/uL — ABNORMAL LOW (ref 150–440)
RBC: 4 MIL/uL — AB (ref 4.40–5.90)
RDW: 20.4 % — ABNORMAL HIGH (ref 11.5–14.5)
WBC: 4.7 10*3/uL (ref 3.8–10.6)

## 2015-09-20 LAB — COMPREHENSIVE METABOLIC PANEL
ALT: 13 U/L — AB (ref 17–63)
AST: 21 U/L (ref 15–41)
Albumin: 3.5 g/dL (ref 3.5–5.0)
Alkaline Phosphatase: 78 U/L (ref 38–126)
Anion gap: 6 (ref 5–15)
BILIRUBIN TOTAL: 0.9 mg/dL (ref 0.3–1.2)
BUN: 24 mg/dL — AB (ref 6–20)
CO2: 27 mmol/L (ref 22–32)
CREATININE: 1.18 mg/dL (ref 0.61–1.24)
Calcium: 8.9 mg/dL (ref 8.9–10.3)
Chloride: 103 mmol/L (ref 101–111)
GFR calc Af Amer: 56 mL/min — ABNORMAL LOW (ref >60)
GFR, EST NON AFRICAN AMERICAN: 48 mL/min — AB (ref >60)
Glucose, Bld: 157 mg/dL — ABNORMAL HIGH (ref 65–99)
Potassium: 4.2 mmol/L (ref 3.5–5.1)
Sodium: 136 mmol/L (ref 135–145)
TOTAL PROTEIN: 6.4 g/dL — AB (ref 6.5–8.1)

## 2015-10-11 ENCOUNTER — Encounter
Admission: RE | Admit: 2015-10-11 | Discharge: 2015-10-11 | Disposition: A | Discharge status: Home / Self Care | Payer: Medicare Other | Source: Ambulatory Visit | Attending: Internal Medicine | Admitting: Internal Medicine

## 2015-11-11 ENCOUNTER — Encounter
Admission: RE | Admit: 2015-11-11 | Discharge: 2015-11-11 | Disposition: A | Discharge status: Home / Self Care | Payer: Medicare Other | Source: Ambulatory Visit | Attending: Internal Medicine | Admitting: Internal Medicine

## 2015-12-11 ENCOUNTER — Encounter
Admission: RE | Admit: 2015-12-11 | Discharge: 2015-12-11 | Disposition: A | Discharge status: Home / Self Care | Payer: Medicare Other | Source: Ambulatory Visit | Attending: Internal Medicine | Admitting: Internal Medicine

## 2016-01-11 ENCOUNTER — Encounter
Admission: RE | Admit: 2016-01-11 | Discharge: 2016-01-11 | Disposition: A | Discharge status: Home / Self Care | Payer: Medicare Other | Source: Ambulatory Visit | Attending: Internal Medicine | Admitting: Internal Medicine

## 2016-02-09 ENCOUNTER — Non-Acute Institutional Stay (SKILLED_NURSING_FACILITY): Payer: Medicare Other | Admitting: Gerontology

## 2016-02-09 DIAGNOSIS — Z515 Encounter for palliative care: Secondary | ICD-10-CM | POA: Diagnosis not present

## 2016-02-09 NOTE — Progress Notes (Signed)
Location:      Place of Service:  SNF (31) Provider:  Lorenso QuarryShannon Ashleymarie Granderson, NP-C  No primary care provider on file.  No care team member to display  Extended Emergency Contact Information Primary Emergency Contact: [address],DAVID Address: [address]          [place]KNOWN Home Phone: [phone] Relation: None Secondary Emergency Contact: Simmons,Ilene Address: [address]          [place], [place] [id number] [place] Home Phone: [phone] Mobile Phone: [phone] Relation: Daughter  Code Status:  dnr Goals of care: Advanced Directive information No flowsheet data found.   Chief Complaint  Patient presents with  . Acute Visit    HPI:  Pt is a [age] y.o. male seen today for an acute visit for apparent dying care. Pt was severely hypertensive this morning. Hospice RN/ staff RN monitored pressures. Recheck at lunchtime showed B/P of 86/42 with HR of 40. While observing pt, he began having 45 second periods of apnea followed by rapid respirations at a rate of 60 breaths/ minute. Minimally responsive to touch, stimulation. Hands/ fingers mottling, cyanotic.    No past medical history on file. No past surgical history on file.  Allergies not on file    Medication List    as of 02/09/2016 11:14 PM   You have not been prescribed any medications.     Review of Systems  Constitutional: Positive for activity change, appetite change and fatigue. Negative for chills, diaphoresis and fever.  HENT: Negative for congestion, sneezing, sore throat, trouble swallowing and voice change.   Eyes: Negative for pain, redness and visual disturbance.  Respiratory: Positive for apnea. Negative for cough, choking, chest tightness, shortness of breath and wheezing.   Cardiovascular: Negative for chest pain, palpitations and leg swelling.  Gastrointestinal: Negative for abdominal distention, abdominal pain, constipation, diarrhea and nausea.  Genitourinary: Negative for difficulty urinating,  dysuria, frequency and urgency.  Musculoskeletal: Negative for back pain, gait problem and myalgias. Arthralgias: typical arthritis.  Skin: Positive for pallor. Negative for color change, rash and wound.  Neurological: Negative for dizziness, tremors, syncope, speech difficulty, weakness, numbness and headaches.  Psychiatric/Behavioral: Negative for agitation and behavioral problems.  All other systems reviewed and are negative.    There is no immunization history on file for this patient. There are no preventive care reminders to display for this patient. No flowsheet data found. Functional Status Survey:    Vitals:   02/09/16 2311  BP: (!) 176/107  Pulse: 78  Resp: 20  Temp: 97.7 F (36.5 C)  SpO2: 100%   There is no height or weight on file to calculate BMI. Physical Exam  Constitutional: He appears well-developed. He appears cachectic. He is active and cooperative. He does not appear ill. No distress.  HENT:  Head: Normocephalic and atraumatic.  Mouth/Throat: Uvula is midline, oropharynx is clear and moist and mucous membranes are normal. Mucous membranes are not pale, not dry and not cyanotic.  Eyes: Conjunctivae, EOM and lids are normal. Pupils are equal, round, and reactive to light.  Neck: Trachea normal, normal range of motion and full passive range of motion without pain. Neck supple. No JVD present. No tracheal deviation, no edema and no erythema present. No thyromegaly present.  Cardiovascular: Regular rhythm.  Bradycardia present.  Exam reveals decreased pulses. Exam reveals no gallop, no distant heart sounds and no friction rub.   Murmur heard. Pulmonary/Chest: Effort normal. No accessory muscle usage. Apnea and tachypnea noted. No respiratory distress.  He has decreased breath sounds in the right upper field, the right middle field, the right lower field, the left upper field, the left middle field and the left lower field. He has no wheezes. He has no rhonchi. He has  no rales. He exhibits no tenderness.  Abdominal: Soft. Normal appearance. He exhibits no distension and no ascites. Bowel sounds are decreased. There is no tenderness.  Musculoskeletal: Normal range of motion. He exhibits no edema or tenderness.  Expected osteoarthritis, stiffness  Neurological: He has normal strength. He is unresponsive.  Skin: Skin is warm, dry and intact. He is not diaphoretic. No cyanosis. No pallor. Nails show no clubbing.  Psychiatric: He has a normal mood and affect. His speech is normal and behavior is normal. Judgment and thought content normal. Cognition and memory are normal.  Nursing note and vitals reviewed.   Labs reviewed:  Recent Labs  09/20/15 1735  NA 136  K 4.2  CL 103  CO2 27  GLUCOSE 157*  BUN 24*  CREATININE 1.18  CALCIUM 8.9    Recent Labs  09/20/15 1735  AST 21  ALT 13*  ALKPHOS 78  BILITOT 0.9  PROT 6.4*  ALBUMIN 3.5    Recent Labs  09/20/15 1735  WBC 4.7  NEUTROABS 2.0  HGB 11.4*  HCT 35.4*  MCV 88.5  PLT 83*   Lab Results  Component Value Date   TSH 0.591 07/09/2014   No results found for: HGBA1C No results found for: CHOL, HDL, LDLCALC, LDLDIRECT, TRIG, CHOLHDL  Significant Diagnostic Results in last 30 days:  No results found.  Assessment/Plan 1. Encounter for dying care  Monitor for signs of pain, dyspnea  Prn Morphine concentrate as already ordered  Ensure comfort  Family/ staff Communication:   Total Time:  Documentation:  Face to Face:  Family/Phone: Hospice RN to contact family   Labs/tests ordered:    Medication list reviewed and assessed for continued appropriateness.  Brynda Rim, NP-C Geriatrics [facility] [phone], [place] [address] Cell Phone (Mon-Fri 8am-5pm):  [phone] On Call:  [phone] & follow prompts after 5pm & weekends Office Phone:  [phone] Office Fax:  [phone]

## 2016-02-11 ENCOUNTER — Encounter
Admission: RE | Admit: 2016-02-11 | Discharge: 2016-02-11 | Disposition: A | Discharge status: Home / Self Care | Payer: Medicare Other | Source: Ambulatory Visit | Attending: Internal Medicine | Admitting: Internal Medicine

## 2016-03-01 ENCOUNTER — Non-Acute Institutional Stay (SKILLED_NURSING_FACILITY): Payer: Medicare Other | Admitting: Gerontology

## 2016-03-01 DIAGNOSIS — R05 Cough: Secondary | ICD-10-CM | POA: Diagnosis not present

## 2016-03-01 DIAGNOSIS — R059 Cough, unspecified: Secondary | ICD-10-CM

## 2016-03-08 NOTE — Progress Notes (Signed)
Location:      Place of Service:  SNF (31) Provider:  Lorenso Quarry, NP-C  No primary care provider on file.  No care team member to display  Extended Emergency Contact Information Primary Emergency Contact: [facility] Address: [address] Home Phone: [phone] Relation: None Secondary Emergency Contact: Simmons,Ilene Address: [address] [place] [address] Darden [place] of [place] Home Phone: [phone] Mobile Phone: [phone] Relation: Daughter  Code Status:  DO NOT RESUSCITATE Goals of care: Advanced Directive information No flowsheet data found.   Chief Complaint  Patient presents with  . Acute Visit    HPI:  Pt is a 82 y.o. male seen today for an acute visit for Complaints of cough. Patient reports he has had this cough for several days. Denies production of phlegm. Afebrile. No nausea, vomiting, diarrhea, fever, chills, chest pain, shortness of breath, headache. Cough is just irritating to the patient. Vital signs stable. No other complaints.   No past medical history on file. No past surgical history on file.  Allergies not on file    Medication List    as of 03/01/2016 11:59 PM   You have not been prescribed any medications.     Review of Systems  Constitutional: Positive for fatigue. Negative for activity change, appetite change, chills, diaphoresis and fever.  HENT: Negative for congestion, sneezing, sore throat, trouble swallowing and voice change.   Eyes: Negative for pain, redness and visual disturbance.  Respiratory: Positive for cough. Negative for apnea, choking, chest tightness, shortness of breath and wheezing.   Cardiovascular: Negative for chest pain, palpitations and leg swelling.  Musculoskeletal: Negative for back pain, gait problem and myalgias. Arthralgias: typical arthritis.  Skin: Positive for pallor. Negative for color change, rash and wound.  Neurological: Negative for dizziness, tremors, syncope,  speech difficulty, weakness, numbness and headaches.  All other systems reviewed and are negative.    There is no immunization history on file for this patient. Pertinent  Health Maintenance Due  Topic Date Due  . PNA vac Low Risk Adult (1 of 2 - PCV13) 03/04/1979  . INFLUENZA VACCINE  01/11/2016   No flowsheet data found. Functional Status Survey:    Vitals:   03/01/16 0700  BP: 127/78  Pulse: 81  Resp: 20  Temp: 97.6 F (36.4 C)  SpO2: 99%  Weight: 138 lb 11.2 oz (62.9 kg)   There is no height or weight on file to calculate BMI. Physical Exam  Constitutional: He is oriented to person, place, and time. He appears well-developed. He appears cachectic. He is active and cooperative. He does not appear ill. No distress.  HENT:  Head: Normocephalic and atraumatic.  Mouth/Throat: Uvula is midline, oropharynx is clear and moist and mucous membranes are normal. Mucous membranes are not pale, not dry and not cyanotic.  Eyes: Conjunctivae, EOM and lids are normal. Pupils are equal, round, and reactive to light.  Neck: Trachea normal, normal range of motion and full passive range of motion without pain. Neck supple. No JVD present. No tracheal deviation, no edema and no erythema present. No thyromegaly present.  Cardiovascular: Regular rhythm.  Bradycardia present.  Exam reveals decreased pulses. Exam reveals no gallop, no distant heart sounds and no friction rub.   Murmur heard. Pulmonary/Chest: Effort normal and breath sounds normal. No accessory muscle usage. No apnea and no tachypnea. No respiratory distress. He has no decreased breath  sounds. He has no wheezes. He has no rhonchi. He has no rales. He exhibits no tenderness.  Abdominal: Soft. Normal appearance. He exhibits no distension and no ascites. Bowel sounds are decreased. There is no tenderness.  Musculoskeletal: Normal range of motion. He exhibits no edema or tenderness.  Expected osteoarthritis, stiffness  Neurological: He  is alert and oriented to person, place, and time. He has normal strength.  Skin: Skin is warm, dry and intact. He is not diaphoretic. No cyanosis. No pallor. Nails show no clubbing.  Psychiatric: He has a normal mood and affect. His speech is normal and behavior is normal. Judgment and thought content normal. Cognition and memory are normal.  Nursing note and vitals reviewed.   Labs reviewed:  Recent Labs  09/20/15 1735  NA 136  K 4.2  CL 103  CO2 27  GLUCOSE 157*  BUN 24*  CREATININE 1.18  CALCIUM 8.9    Recent Labs  09/20/15 1735  AST 21  ALT 13*  ALKPHOS 78  BILITOT 0.9  PROT 6.4*  ALBUMIN 3.5    Recent Labs  09/20/15 1735  WBC 4.7  NEUTROABS 2.0  HGB 11.4*  HCT 35.4*  MCV 88.5  PLT 83*   Lab Results  Component Value Date   TSH 0.591 07/09/2014   No results found for: HGBA1C No results found for: CHOL, HDL, LDLCALC, LDLDIRECT, TRIG, CHOLHDL  Significant Diagnostic Results in last 30 days:  No results found.  Assessment/Plan 1. Cough  Mucinex DM 100 mg per 5 mL-give 10 mL by mouth every 4 hours when necessary cough suppression  Family/ staff Communication:   Total Time:  Documentation:  Face to Face:  Family/Phone:   Labs/tests ordered:    Medication list reviewed and assessed for continued appropriateness.  Brynda RimShannon H. Jeramine Delis, NP-C Geriatrics [facility] [phone] [address] [place], [place] [address] Cell Phone (Mon-Fri 8am-5pm):  [phone] On Call:  [phone] & follow prompts after 5pm & weekends Office Phone:  [phone] Office Fax:  [phone]

## 2016-03-12 ENCOUNTER — Encounter
Admission: RE | Admit: 2016-03-12 | Discharge: 2016-03-12 | Disposition: A | Discharge status: Home / Self Care | Payer: Medicare Other | Source: Ambulatory Visit | Attending: Internal Medicine | Admitting: Internal Medicine

## 2016-04-12 ENCOUNTER — Encounter
Admission: RE | Admit: 2016-04-12 | Discharge: 2016-04-12 | Disposition: A | Discharge status: Home / Self Care | Payer: Medicare Other | Source: Ambulatory Visit | Attending: Internal Medicine | Admitting: Internal Medicine

## 2016-04-21 ENCOUNTER — Non-Acute Institutional Stay (SKILLED_NURSING_FACILITY): Payer: Medicare Other | Admitting: Gerontology

## 2016-04-21 DIAGNOSIS — R21 Rash and other nonspecific skin eruption: Secondary | ICD-10-CM | POA: Diagnosis not present

## 2016-04-30 NOTE — Progress Notes (Signed)
  Location:      Place of Service:  SNF (31) Provider:  Lorenso QuarryShannon Jolon Degante, NP-C  No primary care provider on file.  No care team member to display  Extended Emergency Contact Information [facility] Emergency Contact: [facility] Address: [address]          UNKNOWN Home Phone: [phone] Relation: None Secondary Emergency Contact: Simmons,Ilene Address: [address]          [place], [place] [id number] [place] Home Phone: [phone] Mobile Phone: [phone] Relation: Daughter  Code Status:  dnr Goals of care: Advanced Directive information No flowsheet data found.   Chief Complaint  Patient presents with  . Acute Visit    HPI:  Pt is a [age] y.o. male seen today for an acute visit for skin rash. Staff noticed rash on RLE yesterday. Rash is red, flat/ not-raised. Pt denies itching, pain, burning, tingling. Pt says it is not bothering him. There have been no medication changes, detergent changes, etc. VSS. No other complaints. No treatment at this time.    No past medical history on file. No past surgical history on file.  Allergies not on file    Medication List    as of 04/21/2016 11:59 PM   You have not been prescribed any medications.     Review of Systems  Constitutional: Positive for diaphoresis (warm in the room). Negative for activity change, appetite change and chills.  Skin: Positive for rash.  Allergic/Immunologic: Negative for environmental allergies and food allergies.     There is no immunization history on file for this patient. Pertinent  Health Maintenance Due  Topic Date Due  . PNA vac Low Risk Adult (1 of 2 - PCV13) 03/04/1979  . INFLUENZA VACCINE  01/11/2016   No flowsheet data found. Functional Status Survey:    Vitals:   04/19/16 0600  BP: (!) 95/58  Pulse: 73  Resp: 16  Temp: (!) 96.2 F (35.7 C)  SpO2: 95%   There is no height or weight on file to calculate BMI. Physical Exam  Skin: Skin is warm. Rash noted. He is  diaphoretic. No cyanosis. There is pallor. Nails show no clubbing.       Labs reviewed:  Recent Labs  09/20/15 1735  NA 136  K 4.2  CL 103  CO2 27  GLUCOSE 157*  BUN 24*  CREATININE 1.18  CALCIUM 8.9    Recent Labs  09/20/15 1735  AST 21  ALT 13*  ALKPHOS 78  BILITOT 0.9  PROT 6.4*  ALBUMIN 3.5    Recent Labs  09/20/15 1735  WBC 4.7  NEUTROABS 2.0  HGB 11.4*  HCT 35.4*  MCV 88.5  PLT 83*   Lab Results  Component Value Date   TSH 0.591 07/09/2014   No results found for: HGBA1C No results found for: CHOL, HDL, LDLCALC, LDLDIRECT, TRIG, CHOLHDL  Significant Diagnostic Results in last 30 days:  No results found.  Assessment/Plan 1. Skin rash  Cooler air temperatures  Wash skin well  Moisturize skin  Family/ staff Communication:   Total Time:  Documentation:  Face to Face:  Family/Phone:   Labs/tests ordered:    Medication list reviewed and assessed for continued appropriateness.  Brynda RimShannon H. Demetrus Pavao, NP-C Geriatrics [facility] [phone] [address] [place], [place] [address] Cell Phone (Mon-Fri 8am-5pm):  [phone] On Call:  [phone] & follow prompts after 5pm & weekends Office Phone:  [phone] Office Fax:  [phone]

## 2016-05-12 ENCOUNTER — Encounter
Admission: RE | Admit: 2016-05-12 | Discharge: 2016-05-12 | Disposition: A | Discharge status: Home / Self Care | Payer: Medicare Other | Source: Ambulatory Visit | Attending: Internal Medicine | Admitting: Internal Medicine

## 2016-06-12 ENCOUNTER — Encounter
Admission: RE | Admit: 2016-06-12 | Discharge: 2016-06-12 | Disposition: A | Discharge status: Home / Self Care | Payer: Medicare Other | Source: Ambulatory Visit | Attending: Internal Medicine | Admitting: Internal Medicine

## 2016-07-13 ENCOUNTER — Encounter
Admission: RE | Admit: 2016-07-13 | Discharge: 2016-07-13 | Disposition: A | Discharge status: Home / Self Care | Payer: Medicare Other | Source: Ambulatory Visit | Attending: Internal Medicine | Admitting: Internal Medicine

## 2016-08-10 ENCOUNTER — Encounter
Admission: RE | Admit: 2016-08-10 | Discharge: 2016-08-10 | Disposition: A | Discharge status: Home / Self Care | Payer: Medicare Other | Source: Ambulatory Visit | Attending: Internal Medicine | Admitting: Internal Medicine

## 2016-08-10 ENCOUNTER — Encounter: Admission: RE | Admit: 2016-08-10 | Payer: Self-pay | Source: Ambulatory Visit | Admitting: Internal Medicine

## 2016-08-17 ENCOUNTER — Non-Acute Institutional Stay (SKILLED_NURSING_FACILITY): Payer: Medicare Other | Admitting: Gerontology

## 2016-08-17 DIAGNOSIS — G988 Other disorders of nervous system: Secondary | ICD-10-CM | POA: Diagnosis not present

## 2016-08-17 DIAGNOSIS — F0280 Dementia in other diseases classified elsewhere without behavioral disturbance: Secondary | ICD-10-CM | POA: Diagnosis not present

## 2016-08-17 DIAGNOSIS — I4891 Unspecified atrial fibrillation: Secondary | ICD-10-CM | POA: Diagnosis not present

## 2016-08-17 DIAGNOSIS — F0390 Unspecified dementia without behavioral disturbance: Secondary | ICD-10-CM

## 2016-08-24 DIAGNOSIS — F0280 Dementia in other diseases classified elsewhere without behavioral disturbance: Secondary | ICD-10-CM | POA: Insufficient documentation

## 2016-08-24 DIAGNOSIS — I4891 Unspecified atrial fibrillation: Secondary | ICD-10-CM | POA: Insufficient documentation

## 2016-08-24 NOTE — Progress Notes (Signed)
Location:      Place of Service:  SNF (31) Provider:  Lorenso Quarry, NP-C  No primary care provider on file.  No care team member to display  Extended Emergency Contact Information Primary Emergency Contact: [facility] Address: [address] Home Phone: [phone] Relation: None Secondary Emergency Contact: Simmons,Ilene Address: [address] [place] [address] Darden Amber of [place] Home Phone: [phone] Mobile Phone: [phone] Relation: Daughter  Code Status:  dnr Goals of care: Advanced Directive information No flowsheet data found.   Chief Complaint  Patient presents with  . Medical Management of Chronic Issues    HPI:  Pt is a 83 y.o. male seen today for medical management of chronic diseases. Pt has dementia that is progressive, slowly. Pt has remained stable. Pt is a total care with all ADLs. Pt is legally blind and has severe hearing impairment. He has been on Hospice care with slow decline. Pt stays in his room, in bed almost all the time now. Pt still enjoys company, but falls asleep easily during visit. VSS. No other complaints.     No past medical history on file. No past surgical history on file.  Allergies not on file  Allergies as of 08/17/2016   Not on File     Medication List    as of 08/17/2016 11:59 PM   You have not been prescribed any medications.     Review of Systems  Constitutional: Positive for fatigue. Negative for activity change, appetite change, chills, diaphoresis and fever.  HENT: Negative for congestion, sneezing, sore throat, trouble swallowing and voice change.   Eyes: Negative for pain, redness and visual disturbance.  Respiratory: Positive for cough. Negative for apnea, choking, chest tightness, shortness of breath and wheezing.   Cardiovascular: Negative for chest pain, palpitations and leg swelling.  Musculoskeletal: Negative for back pain, gait problem and myalgias. Arthralgias: typical  arthritis.  Skin: Positive for pallor. Negative for color change, rash and wound.  Allergic/Immunologic: Negative for environmental allergies and food allergies.  Neurological: Negative for dizziness, tremors, syncope, speech difficulty, weakness, numbness and headaches.  All other systems reviewed and are negative.    There is no immunization history on file for this patient. Pertinent  Health Maintenance Due  Topic Date Due  . PNA vac Low Risk Adult (1 of 2 - PCV13) 03/04/1979  . INFLUENZA VACCINE  01/11/2016   No flowsheet data found. Functional Status Survey:    Vitals:   08/02/16 0745  BP: (!) 158/109  Pulse: 65  Resp: 16  Temp: 97.8 F (36.6 C)  SpO2: 100%  Weight: 136 lb 1.6 oz (61.7 kg)   There is no height or weight on file to calculate BMI. Physical Exam  Constitutional: He is oriented to person, place, and time. He appears well-developed. He appears cachectic. He is active and cooperative. He does not appear ill. No distress.  HENT:  Head: Normocephalic and atraumatic.  Mouth/Throat: Uvula is midline, oropharynx is clear and moist and mucous membranes are normal. Mucous membranes are not pale, not dry and not cyanotic.  Eyes: Conjunctivae, EOM and lids are normal. Pupils are equal, round, and reactive to light.  Neck: Trachea normal, normal range of motion and full passive range of motion without pain. Neck supple. No JVD present. No tracheal deviation, no edema and no erythema present. No thyromegaly present.  Cardiovascular: Regular rhythm.  Bradycardia present.  Exam reveals decreased pulses. Exam reveals no gallop, no distant heart sounds and no friction rub.   Murmur heard. Pulmonary/Chest: Effort normal and breath sounds normal. No accessory muscle usage. No apnea and no tachypnea. No respiratory distress. He has no decreased breath sounds. He has no wheezes. He has no rhonchi. He has no rales. He exhibits no tenderness.  Abdominal: Soft. Normal appearance. He  exhibits no distension and no ascites. Bowel sounds are decreased. There is no tenderness.  Musculoskeletal: Normal range of motion. He exhibits no edema or tenderness.  Expected osteoarthritis, stiffness  Neurological: He is alert and oriented to person, place, and time. He has normal strength.  Skin: Skin is warm, dry and intact. No rash noted. He is not diaphoretic. No cyanosis or erythema. No pallor. Nails show no clubbing.  Psychiatric: He has a normal mood and affect. His speech is normal and behavior is normal. Judgment and thought content normal. Cognition and memory are normal.  Nursing note and vitals reviewed.   Labs reviewed:  Recent Labs  09/20/15 1735  NA 136  K 4.2  CL 103  CO2 27  GLUCOSE 157*  BUN 24*  CREATININE 1.18  CALCIUM 8.9    Recent Labs  09/20/15 1735  AST 21  ALT 13*  ALKPHOS 78  BILITOT 0.9  PROT 6.4*  ALBUMIN 3.5    Recent Labs  09/20/15 1735  WBC 4.7  NEUTROABS 2.0  HGB 11.4*  HCT 35.4*  MCV 88.5  PLT 83*   Lab Results  Component Value Date   TSH 0.591 07/09/2014   No results found for: HGBA1C No results found for: CHOL, HDL, LDLCALC, LDLDIRECT, TRIG, CHOLHDL  Significant Diagnostic Results in last 30 days:  No results found.  Assessment/Plan 1. Dementia, neurological  Stable and progressive  Pt continues on Hospice services  2. Atrial Fibrillation  Stable  No medication necessary for control  Family/ staff Communication:   Total Time:  Documentation:  Face to Face:  Family/Phone:   Labs/tests ordered:    Medication list reviewed and assessed for continued appropriateness. Monthly medication orders reviewed and signed.  Brynda RimShannon H. Teyana Pierron, NP-C Geriatrics [facility] [phone] [address] [place], [place] [address] Cell Phone (Mon-Fri 8am-5pm):  [phone] On Call:  [phone] & follow prompts after 5pm & weekends Office Phone:  [phone] Office Fax:   [phone]

## 2016-09-04 ENCOUNTER — Non-Acute Institutional Stay (SKILLED_NURSING_FACILITY): Payer: Medicare Other | Admitting: Gerontology

## 2016-09-04 DIAGNOSIS — L858 Other specified epidermal thickening: Secondary | ICD-10-CM

## 2016-09-04 NOTE — Progress Notes (Signed)
Location:      Place of Service:  SNF (31) Provider:  Lorenso QuarryShannon Y[address] Circle, NP-C  No primary care provider on file.  No care team member to display  Extended Emergency Contact Information [facility] Contact: [facility] Address: [address]          UNKNOWN Home Phone: [phone] Relation: None Secondary Emergency Contact: Simmons,Ilene Address: [address]          [place], [place] [address] [place] Home Phone: [phone] Mobile Phone: [phone] Relation: Daughter  Code Status:  DNR Goals of care: Advanced Directive information No flowsheet data found.   Chief Complaint  Patient presents with  . Acute Visit    HPI:  Pt is a [age] y.o. male seen today for an acute visit for skin lesion on the left ear. Pt has a large area of hypertrophic skin tissue ~ 2 cm long protruding from lateral border of left ear pinna. The area of attachment is dry, flaky with dried blood. Staff report pt scratches at he lesion at times. Surrounding tissue intact without redness, warmth. Otherwise, the area does not seem to be of concern. VSS. No other complaints.    No past medical history on file. No past surgical history on file.  Allergies not on file  Allergies as of 09/04/2016   Not on File     Medication List    as of 09/04/2016 10:48 PM   You have not been prescribed any medications.     Review of Systems  Constitutional: Positive for diaphoresis (warm in the room). Negative for activity change, appetite change and chills.  Respiratory: Negative.   Cardiovascular: Negative.   Skin: Positive for rash.  Allergic/Immunologic: Negative for environmental allergies and food allergies.     There is no immunization history on file for this patient. Pertinent  Health Maintenance Due  Topic Date Due  . PNA vac Low Risk Adult (1 of 2 - PCV13) 03/04/1979  . INFLUENZA VACCINE  01/11/2016   No flowsheet data found. Functional Status Survey:    Vitals:   08/23/16  0330  BP: (!) 153/101  Pulse: 77  Resp: 14  Temp: 97.5 F (36.4 C)  SpO2: 97%   There is no height or weight on file to calculate BMI. Physical Exam  Skin: Skin is warm. Lesion noted. He is diaphoretic. No cyanosis. There is pallor. Nails show no clubbing.       Labs reviewed:  Recent Labs  09/20/15 1735  NA 136  K 4.2  CL 103  CO2 27  GLUCOSE 157*  BUN 24*  CREATININE 1.18  CALCIUM 8.9    Recent Labs  09/20/15 1735  AST 21  ALT 13*  ALKPHOS 78  BILITOT 0.9  PROT 6.4*  ALBUMIN 3.5    Recent Labs  09/20/15 1735  WBC 4.7  NEUTROABS 2.0  HGB 11.4*  HCT 35.4*  MCV 88.5  PLT 83*   Lab Results  Component Value Date   TSH 0.591 07/09/2014   No results found for: HGBA1C No results found for: CHOL, HDL, LDLCALC, LDLDIRECT, TRIG, CHOLHDL  Significant Diagnostic Results in last 30 days:  No results found.  Assessment/Plan 1. Cutaneous horn  Vaseline- apply liberal amount of Vaseline to cutaneous horn on the left ear daily. Cover with Bandaid.  Family/ staff Communication:   Total Time:  Documentation:  Face to Face:  Family/Phone:   Labs/tests ordered:    Medication list reviewed and assessed for continued appropriateness.  Brynda Rim, NP-C Geriatrics [facility] [phone] [address] [place], [place] [address] Cell Phone (Mon-Fri 8am-5pm):  [phone] On Call:  [phone] & follow prompts after 5pm & weekends Office Phone:  [phone] Office Fax:  [phone]

## 2016-09-10 ENCOUNTER — Encounter
Admission: RE | Admit: 2016-09-10 | Discharge: 2016-09-10 | Disposition: A | Discharge status: Home / Self Care | Payer: Medicare Other | Source: Ambulatory Visit | Attending: Internal Medicine | Admitting: Internal Medicine

## 2016-10-10 ENCOUNTER — Encounter
Admission: RE | Admit: 2016-10-10 | Discharge: 2016-10-10 | Disposition: A | Discharge status: Home / Self Care | Payer: Medicare Other | Source: Ambulatory Visit | Attending: Internal Medicine | Admitting: Internal Medicine

## 2016-10-30 ENCOUNTER — Non-Acute Institutional Stay (SKILLED_NURSING_FACILITY): Payer: Medicare Other | Admitting: Gerontology

## 2016-10-30 DIAGNOSIS — I2510 Atherosclerotic heart disease of native coronary artery without angina pectoris: Secondary | ICD-10-CM

## 2016-10-30 DIAGNOSIS — H3530 Unspecified macular degeneration: Secondary | ICD-10-CM

## 2016-10-30 DIAGNOSIS — Z9181 History of falling: Secondary | ICD-10-CM

## 2016-10-30 DIAGNOSIS — I4891 Unspecified atrial fibrillation: Secondary | ICD-10-CM | POA: Diagnosis not present

## 2016-10-30 DIAGNOSIS — N400 Enlarged prostate without lower urinary tract symptoms: Secondary | ICD-10-CM

## 2016-10-30 DIAGNOSIS — E785 Hyperlipidemia, unspecified: Secondary | ICD-10-CM

## 2016-10-30 DIAGNOSIS — H548 Legal blindness, as defined in USA: Secondary | ICD-10-CM

## 2016-10-30 DIAGNOSIS — E039 Hypothyroidism, unspecified: Secondary | ICD-10-CM | POA: Diagnosis not present

## 2016-10-30 DIAGNOSIS — I739 Peripheral vascular disease, unspecified: Secondary | ICD-10-CM | POA: Diagnosis not present

## 2016-10-30 DIAGNOSIS — F0280 Dementia in other diseases classified elsewhere without behavioral disturbance: Secondary | ICD-10-CM

## 2016-10-30 DIAGNOSIS — M1990 Unspecified osteoarthritis, unspecified site: Secondary | ICD-10-CM

## 2016-10-30 DIAGNOSIS — K5900 Constipation, unspecified: Secondary | ICD-10-CM | POA: Insufficient documentation

## 2016-10-30 DIAGNOSIS — I350 Nonrheumatic aortic (valve) stenosis: Secondary | ICD-10-CM | POA: Diagnosis not present

## 2016-10-30 DIAGNOSIS — G500 Trigeminal neuralgia: Secondary | ICD-10-CM | POA: Diagnosis not present

## 2016-10-30 DIAGNOSIS — M48061 Spinal stenosis, lumbar region without neurogenic claudication: Secondary | ICD-10-CM

## 2016-10-30 DIAGNOSIS — R1312 Dysphagia, oropharyngeal phase: Secondary | ICD-10-CM | POA: Insufficient documentation

## 2016-10-30 NOTE — Progress Notes (Signed)
Location:      Place of Service:  SNF (31) Provider:  Lorenso QuarryShannon Tiona Ruane, NP-C  No primary care provider on file.  No care team member to display  Extended Emergency Contact Information [facility] Address: [address]          [place] Home Phone: [phone] Relation: None Secondary Emergency Contact: Simmons,Ilene Address: [address]          [place], [place] [id number] [place] Home Phone: [phone] Mobile Phone: [phone] Relation: Daughter  Code Status:  DNR Goals of care: Advanced Directive information No flowsheet data found.   Chief Complaint  Patient presents with  . Medical Management of Chronic Issues    HPI:  Pt is a [age] y.o. male seen today for medical management of chronic diseases. Pt sleeps the majority of the time. Awakes to eat some, then goes back to sleep. Appetite fair. All chronic conditions are stable, no medication treatment at this time aside from Nitro patch and Aspercreme. Pt remains on Hospice services. Pt has a very slow decline. Not in any distress. VSS.     No past medical history on file. No past surgical history on file.  Allergies not on file  Allergies as of 10/30/2016   Not on File     Medication List    as of 10/30/2016  5:43 PM   You have not been prescribed any medications.     Review of Systems  Constitutional: Positive for fatigue. Negative for activity change, appetite change, chills, diaphoresis and fever.  HENT: Negative for congestion, sneezing, sore throat, trouble swallowing and voice change.   Eyes: Negative for pain, redness and visual disturbance.  Respiratory: Negative for apnea, cough, choking, chest tightness, shortness of breath and wheezing.   Cardiovascular: Negative for chest pain, palpitations and leg swelling.  Musculoskeletal: Negative for back pain, gait problem and myalgias. Arthralgias: typical arthritis.  Skin: Positive for pallor. Negative for color change,  rash and wound.  Allergic/Immunologic: Negative for environmental allergies and food allergies.  Neurological: Negative for dizziness, tremors, syncope, speech difficulty, weakness, numbness and headaches.  All other systems reviewed and are negative.    There is no immunization history on file for this patient. Pertinent  Health Maintenance Due  Topic Date Due  . PNA vac Low Risk Adult (1 of 2 - PCV13) 03/04/1979  . INFLUENZA VACCINE  01/10/2017   No flowsheet data found. Functional Status Survey:    Vitals:   10/03/16 2230  BP: 131/82  Pulse: 82  Resp: 18  Temp: 97.4 F (36.3 C)  SpO2: 93%  Weight: 130 lb (59 kg)   There is no height or weight on file to calculate BMI. Physical Exam  Constitutional: He is oriented to person, place, and time. He appears well-developed. He appears cachectic. He is active and cooperative. He does not appear ill. No distress.  HENT:  Head: Normocephalic and atraumatic.  Mouth/Throat: Uvula is midline, oropharynx is clear and moist and mucous membranes are normal. Mucous membranes are not pale, not dry and not cyanotic.  Eyes: Conjunctivae, EOM and lids are normal. Pupils are equal, round, and reactive to light.  Neck: Trachea normal, normal range of motion and full passive range of motion without pain. Neck supple. No JVD present. No tracheal deviation, no edema and no erythema present. No thyromegaly present.  Cardiovascular: Regular rhythm.  Bradycardia present.  Exam reveals decreased pulses. Exam reveals no gallop, no distant heart sounds and no friction  rub.   Murmur heard. Pulmonary/Chest: Effort normal and breath sounds normal. No accessory muscle usage. No apnea and no tachypnea. No respiratory distress. He has no decreased breath sounds. He has no wheezes. He has no rhonchi. He has no rales. He exhibits no tenderness.  Abdominal: Soft. Normal appearance. He exhibits no distension and no ascites. Bowel sounds are decreased. There is no  tenderness.  Musculoskeletal: Normal range of motion. He exhibits no edema or tenderness.  Expected osteoarthritis, stiffness  Neurological: He is alert and oriented to person, place, and time. He has normal strength.  Skin: Skin is warm, dry and intact. No rash noted. He is not diaphoretic. No cyanosis or erythema. No pallor. Nails show no clubbing.  Psychiatric: He has a normal mood and affect. His speech is normal and behavior is normal. Judgment and thought content normal. Cognition and memory are normal.  Nursing note and vitals reviewed.   Labs reviewed: No results for input(s): NA, K, CL, CO2, GLUCOSE, BUN, CREATININE, CALCIUM, MG, PHOS in the last 8760 hours. No results for input(s): AST, ALT, ALKPHOS, BILITOT, PROT, ALBUMIN in the last 8760 hours. No results for input(s): WBC, NEUTROABS, HGB, HCT, MCV, PLT in the last 8760 hours. Lab Results  Component Value Date   TSH 0.591 07/09/2014   No results found for: HGBA1C No results found for: CHOL, HDL, LDLCALC, LDLDIRECT, TRIG, CHOLHDL  Significant Diagnostic Results in last 30 days:  No results found.  Assessment/Plan 1. Spinal stenosis, lumbar region, without neurogenic claudication  Stable   2. Macular degeneration, unspecified laterality, unspecified type  Stable   3. Legal blindness, as defined in [place]  Stable   4. Atrial fibrillation, unspecified type (HCC)  Stable   5. Nonrheumatic aortic (valve) stenosis  Stable   6. Atherosclerosis of native coronary artery of native heart without angina pectoris  Stable   Continue Nitroglycerin 0.1 mg/ hr patch Q Am, remove at HS  7. Peripheral vascular disease, unspecified (HCC)  Stable   8. Hyperlipidemia, unspecified hyperlipidemia type  Stable   9. Hypothyroidism, unspecified type  Stable   10. Benign prostatic hyperplasia without lower urinary tract symptoms  Stable   11. Trigeminal neuralgia  Stable   12. Constipation, unspecified constipation  type  Stable   13. Hx of falling  Stable   14. Dysphagia, oropharyngeal phase  Stable   15. Osteoarthritis, unspecified osteoarthritis type, unspecified site  Stable   Continue Aspercreme 10%- small amount to hands, feet, toes, ankles Q 12 hours for pain  Continue Acetaminophen 650 mg po Q 4 hours prn  16. Dementia in other diseases classified elsewhere without behavioral disturbance  Stable     Family/ staff Communication:   Total Time:  Documentation:  Face to Face:  Family/Phone:   Labs/tests ordered:    Medication list reviewed and assessed for continued appropriateness. Monthly medication orders reviewed and signed.  Brynda Rim, NP-C Geriatrics [facility] [phone], [place] [address] Cell Phone (Mon-Fri 8am-5pm):  [phone] On Call:  [phone] & follow prompts after 5pm & weekends Office Phone:  [phone] Office Fax:  [phone]

## 2016-11-10 ENCOUNTER — Encounter
Admission: RE | Admit: 2016-11-10 | Discharge: 2016-11-10 | Disposition: A | Discharge status: Home / Self Care | Payer: Medicare Other | Source: Ambulatory Visit | Attending: Internal Medicine | Admitting: Internal Medicine

## 2016-12-10 ENCOUNTER — Encounter
Admission: RE | Admit: 2016-12-10 | Discharge: 2016-12-10 | Disposition: A | Discharge status: Home / Self Care | Payer: Medicare Other | Source: Ambulatory Visit | Attending: Internal Medicine | Admitting: Internal Medicine

## 2017-01-09 ENCOUNTER — Non-Acute Institutional Stay (SKILLED_NURSING_FACILITY): Payer: Medicare Other | Admitting: Gerontology

## 2017-01-09 ENCOUNTER — Encounter: Payer: Self-pay | Admitting: Gerontology

## 2017-01-09 DIAGNOSIS — K5900 Constipation, unspecified: Secondary | ICD-10-CM | POA: Diagnosis not present

## 2017-01-09 DIAGNOSIS — I4891 Unspecified atrial fibrillation: Secondary | ICD-10-CM

## 2017-01-09 DIAGNOSIS — H3530 Unspecified macular degeneration: Secondary | ICD-10-CM

## 2017-01-09 DIAGNOSIS — M48061 Spinal stenosis, lumbar region without neurogenic claudication: Secondary | ICD-10-CM | POA: Diagnosis not present

## 2017-01-09 DIAGNOSIS — I739 Peripheral vascular disease, unspecified: Secondary | ICD-10-CM | POA: Diagnosis not present

## 2017-01-09 DIAGNOSIS — I2510 Atherosclerotic heart disease of native coronary artery without angina pectoris: Secondary | ICD-10-CM | POA: Diagnosis not present

## 2017-01-09 DIAGNOSIS — E039 Hypothyroidism, unspecified: Secondary | ICD-10-CM | POA: Diagnosis not present

## 2017-01-09 DIAGNOSIS — I350 Nonrheumatic aortic (valve) stenosis: Secondary | ICD-10-CM

## 2017-01-09 DIAGNOSIS — F0280 Dementia in other diseases classified elsewhere without behavioral disturbance: Secondary | ICD-10-CM

## 2017-01-09 DIAGNOSIS — G500 Trigeminal neuralgia: Secondary | ICD-10-CM

## 2017-01-09 DIAGNOSIS — R1312 Dysphagia, oropharyngeal phase: Secondary | ICD-10-CM

## 2017-01-09 DIAGNOSIS — H548 Legal blindness, as defined in USA: Secondary | ICD-10-CM | POA: Diagnosis not present

## 2017-01-09 DIAGNOSIS — M1990 Unspecified osteoarthritis, unspecified site: Secondary | ICD-10-CM

## 2017-01-09 DIAGNOSIS — Z9181 History of falling: Secondary | ICD-10-CM

## 2017-01-09 DIAGNOSIS — N400 Enlarged prostate without lower urinary tract symptoms: Secondary | ICD-10-CM | POA: Diagnosis not present

## 2017-01-09 DIAGNOSIS — E785 Hyperlipidemia, unspecified: Secondary | ICD-10-CM | POA: Diagnosis not present

## 2017-01-09 NOTE — Progress Notes (Signed)
.  Location:   [facility] Room Number: 343P Place of Service:  SNF [phone]) Provider:  Lorenso Quarry, NP-C  No primary care provider on file.  No care team member to display  Extended Emergency Contact Information Primary Emergency Contact: [facility] Address: [address] Home Phone: [phone] Relation: None Secondary Emergency Contact: Simmons,Ilene Address: [address] [place] [address] [place] Home Phone: [phone] Mobile Phone: [phone] Relation: Daughter  Code Status:  DNR Goals of care: Advanced Directive information Advanced Directives 01/09/2017  Does Patient Have a Medical Advance Directive? Yes  Type of Estate agent of [employer];Living will;Out of facility DNR (pink MOST or yellow form)  Does patient want to make changes to medical advance directive? No - Patient declined  Copy of Healthcare Power of Attorney in Chart? Yes     Chief Complaint  Patient presents with  . Medical Management of Chronic Issues    Routine Visit    HPI:  Pt is a 83 y.o. male seen today for medical management of chronic diseases. Pt sleeps the majority of the time. Awakes to eat some, then goes back to sleep. Appetite fair. All chronic conditions are stable, no medication treatment at this time aside from Nitro patch and Aspercreme. Pt remains on Hospice services. Pt has a very slow decline. Not in any distress. VSS.     Past Medical History:  Diagnosis Date  . Anemia, unspecified   . Aortic stenosis   . Bladder neck obstruction   . Glaucoma   . History of chicken pox   . Hyperlipidemia, unspecified   . Hypothyroidism, unspecified   . Macular degeneration   . Tic [place]    history of  . Trigeminal neuralgia   . Unspecified atrial fibrillation [facility])    Past Surgical History:  Procedure Laterality Date  . ANAL FISSURECTOMY  1998  . CATARACT EXTRACTION Left 1999  .  STEREOTACTIC RADIOSURGERY / PALLIDOTOMY  10/17/2001   Trigeminal neuralgia, right  . TONSILLECTOMY      Allergies  Allergen Reactions  . Tegretol [Carbamazepine] Rash    Allergies as of 01/09/2017      Reactions   Tegretol [carbamazepine] Rash      Medication List       Accurate as of 01/09/17  9:32 AM. Always use your most recent med list.          acetaminophen 325 MG tablet Commonly known as:  TYLENOL Take 650 mg by mouth every 4 (four) hours as needed. Pain and/or increased temp   ENDIT EX Apply liberal amount topically to areas of skin irritation 3 times daily as needed   nitroGLYCERIN 0.1 mg/hr patch Commonly known as:  NITRODUR - Dosed in mg/24 hr Place 0.1 mg onto the skin daily. Apply every morning and remove at bedtime   NUTRITIONAL SUPPLEMENTS PO Take Magic Cup by mouth 3 times daily between meals.   trolamine salicylate 10 % cream Commonly known as:  ASPERCREME Massage topically into both hands and both feet/toes/ankles every 12 hours for bilateral hand/foot/toe pain.       Review of Systems  Unable to perform ROS: Dementia  Constitutional: Positive for fatigue. Negative for activity change, appetite change, chills, diaphoresis and fever.  HENT: Negative for congestion, sneezing, sore throat, trouble swallowing and voice change.   Eyes: Negative.   Respiratory: Negative  for apnea, cough, choking, chest tightness, shortness of breath and wheezing.   Cardiovascular: Negative for chest pain, palpitations and leg swelling.  Gastrointestinal: Negative.   Genitourinary: Negative.   Musculoskeletal: Negative for back pain, gait problem and myalgias. Arthralgias: typical arthritis.  Skin: Positive for pallor. Negative for color change, rash and wound.  Allergic/Immunologic: Negative for environmental allergies and food allergies.  Neurological: Negative for dizziness, tremors, syncope, speech difficulty, weakness, numbness and headaches.  Hematological:  Negative.   Psychiatric/Behavioral: Negative.   All other systems reviewed and are negative.   Immunization History  Administered Date(s) Administered  . Influenza-Unspecified 03/14/2014, 02/12/2015, 03/12/2016  . PPD Test 03/24/2016  . Pneumococcal-Unspecified 05/05/2014   Pertinent  Health Maintenance Due  Topic Date Due  . PNA vac Low Risk Adult (1 of 2 - PCV13) 03/04/1979  . INFLUENZA VACCINE  01/10/2017   No flowsheet data found. Functional Status Survey:    Vitals:   01/09/17 0847  BP: 99/64  Pulse: 78  Resp: 18  Temp: 97.7 F (36.5 C)  SpO2: 91%  Weight: 120 lb 4.8 oz (54.6 kg)  Height: 5\' 11"  (1.803 m)   Body mass index is 16.78 kg/m. Physical Exam  Constitutional: He is oriented to person, place, and time. He appears well-developed. He appears cachectic. He is active and cooperative. He does not appear ill. No distress.  HENT:  Head: Normocephalic and atraumatic.  Mouth/Throat: Uvula is midline, oropharynx is clear and moist and mucous membranes are normal. Mucous membranes are not pale, not dry and not cyanotic.  Eyes: Pupils are equal, round, and reactive to light. Conjunctivae, EOM and lids are normal.  Neck: Trachea normal, normal range of motion and full passive range of motion without pain. Neck supple. No JVD present. No tracheal deviation, no edema and no erythema present. No thyromegaly present.  Cardiovascular: Regular rhythm.  Bradycardia present.  Exam reveals decreased pulses. Exam reveals no gallop, no distant heart sounds and no friction rub.   Murmur heard. Pulmonary/Chest: Effort normal and breath sounds normal. No accessory muscle usage. No apnea and no tachypnea. No respiratory distress. He has no decreased breath sounds. He has no wheezes. He has no rhonchi. He has no rales. He exhibits no tenderness.  Abdominal: Soft. Normal appearance. He exhibits no distension and no ascites. Bowel sounds are decreased. There is no tenderness.    Musculoskeletal: Normal range of motion. He exhibits no edema or tenderness.  Expected osteoarthritis, stiffness  Neurological: He is alert and oriented to person, place, and time. He has normal strength.  Skin: Skin is warm, dry and intact. No rash noted. He is not diaphoretic. No cyanosis or erythema. No pallor. Nails show no clubbing.  Psychiatric: He has a normal mood and affect. His speech is normal and behavior is normal. Judgment and thought content normal. Cognition and memory are normal.  Nursing note and vitals reviewed.   Labs reviewed: No results for input(s): NA, K, CL, CO2, GLUCOSE, BUN, CREATININE, CALCIUM, MG, PHOS in the last 8760 hours. No results for input(s): AST, ALT, ALKPHOS, BILITOT, PROT, ALBUMIN in the last 8760 hours. No results for input(s): WBC, NEUTROABS, HGB, HCT, MCV, PLT in the last 8760 hours. Lab Results  Component Value Date   TSH 0.591 07/09/2014   No results found for: HGBA1C No results found for: CHOL, HDL, LDLCALC, LDLDIRECT, TRIG, CHOLHDL  Significant Diagnostic Results in last 30 days:  No results found.  Assessment/Plan 1. Spinal stenosis, lumbar region, without neurogenic claudication  Stable   2. Macular degeneration, unspecified laterality, unspecified type  Stable   3. Legal blindness, as defined in [place]  Stable   4. Atrial fibrillation, unspecified type (HCC)  Stable   5. Nonrheumatic aortic (valve) stenosis  Stable   6. Atherosclerosis of native coronary artery of native heart without angina pectoris  Stable   Continue Nitroglycerin 0.1 mg/ hr patch Q Am, remove at HS  7. Peripheral vascular disease, unspecified (HCC)  Stable   8. Hyperlipidemia, unspecified hyperlipidemia type  Stable   9. Hypothyroidism, unspecified type  Stable   10. Benign prostatic hyperplasia without lower urinary tract symptoms  Stable   11. Trigeminal neuralgia  Stable   12. Constipation, unspecified constipation  type  Stable   13. Hx of falling  Stable   14. Dysphagia, oropharyngeal phase  Stable   15. Osteoarthritis, unspecified osteoarthritis type, unspecified site  Stable   Continue Aspercreme 10%- small amount to hands, feet, toes, ankles Q 12 hours for pain  Continue Acetaminophen 650 mg po Q 4 hours prn  16. Dementia in other diseases classified elsewhere without behavioral disturbance  Stable    Family/ staff Communication:   Total Time:  Documentation:  Face to Face:  Family/Phone:   Labs/tests ordered:  none  Medication list reviewed and assessed for continued appropriateness. Monthly medication orders reviewed and signed.  Brynda RimShannon H. Habib Kise, NP-C Geriatrics [facility] [phone] [address] [place], [place] [address] Cell Phone (Mon-Fri 8am-5pm):  [phone] On Call:  [phone] & follow prompts after 5pm & weekends Office Phone:  [phone] Office [phone]:  [phone]

## 2017-01-10 ENCOUNTER — Encounter
Admission: RE | Admit: 2017-01-10 | Discharge: 2017-01-10 | Disposition: A | Discharge status: Home / Self Care | Payer: Medicare Other | Source: Ambulatory Visit | Attending: Internal Medicine | Admitting: Internal Medicine

## 2017-02-09 ENCOUNTER — Non-Acute Institutional Stay (SKILLED_NURSING_FACILITY): Payer: Medicare Other | Admitting: Gerontology

## 2017-02-09 ENCOUNTER — Encounter: Payer: Self-pay | Admitting: Gerontology

## 2017-02-09 DIAGNOSIS — N400 Enlarged prostate without lower urinary tract symptoms: Secondary | ICD-10-CM

## 2017-02-09 DIAGNOSIS — F0280 Dementia in other diseases classified elsewhere without behavioral disturbance: Secondary | ICD-10-CM

## 2017-02-09 DIAGNOSIS — K5900 Constipation, unspecified: Secondary | ICD-10-CM

## 2017-02-09 DIAGNOSIS — H548 Legal blindness, as defined in USA: Secondary | ICD-10-CM | POA: Diagnosis not present

## 2017-02-09 DIAGNOSIS — I350 Nonrheumatic aortic (valve) stenosis: Secondary | ICD-10-CM

## 2017-02-09 DIAGNOSIS — M48061 Spinal stenosis, lumbar region without neurogenic claudication: Secondary | ICD-10-CM

## 2017-02-09 DIAGNOSIS — E785 Hyperlipidemia, unspecified: Secondary | ICD-10-CM

## 2017-02-09 DIAGNOSIS — E039 Hypothyroidism, unspecified: Secondary | ICD-10-CM

## 2017-02-09 DIAGNOSIS — I2510 Atherosclerotic heart disease of native coronary artery without angina pectoris: Secondary | ICD-10-CM | POA: Diagnosis not present

## 2017-02-09 DIAGNOSIS — R1312 Dysphagia, oropharyngeal phase: Secondary | ICD-10-CM

## 2017-02-09 DIAGNOSIS — Z9181 History of falling: Secondary | ICD-10-CM

## 2017-02-09 DIAGNOSIS — H3530 Unspecified macular degeneration: Secondary | ICD-10-CM | POA: Diagnosis not present

## 2017-02-09 DIAGNOSIS — G500 Trigeminal neuralgia: Secondary | ICD-10-CM

## 2017-02-09 DIAGNOSIS — I739 Peripheral vascular disease, unspecified: Secondary | ICD-10-CM | POA: Diagnosis not present

## 2017-02-09 DIAGNOSIS — I4891 Unspecified atrial fibrillation: Secondary | ICD-10-CM

## 2017-02-09 DIAGNOSIS — M1990 Unspecified osteoarthritis, unspecified site: Secondary | ICD-10-CM

## 2017-02-09 NOTE — Progress Notes (Signed)
.  Location:   [facility] Room Number: 343P Place of Service:  SNF [phone]) Provider:  Lorenso Quarry, NP-C  No primary care provider on file.  No care team member to display  Extended Emergency Contact Information Primary Emergency Contact: [facility] Address: [address] Home Phone: [phone] Relation: None Secondary Emergency Contact: Simmons,Ilene Address: [address] [place] [address] Darden Amber of [place] Home Phone: [phone] Mobile Phone: [phone] Relation: Daughter  Code Status:  DNR Goals of care: Advanced Directive information Advanced Directives 02/09/2017  Does Patient Have a Medical Advance Directive? Yes  Type of Estate agent of [place];Out of facility DNR (pink MOST or yellow form)  Does patient want to make changes to medical advance directive? No - Patient declined  Copy of Healthcare Power of Attorney in Chart? Yes  Pre-existing out of facility DNR order (yellow form or pink MOST form) Yellow form placed in chart (order not valid for inpatient use)     Chief Complaint  Patient presents with  . Medical Management of Chronic Issues    Routine Visit    HPI:  Pt is a 83 y.o. male seen today for medical management of chronic diseases. Pt sleeps the majority of the time. Awakes to eat some, then goes back to sleep. Pt did open his eyes briefly during exam, then closed them back without speaking. Appetite fair. All chronic conditions are stable, no medication treatment at this time aside from Nitro patch and Aspercreme. Pt remains on Hospice services. Pt has a very slow decline. Not in any distress. VSS.     Past Medical History:  Diagnosis Date  . Anemia, unspecified   . Aortic stenosis   . Bladder neck obstruction   . Glaucoma   . History of chicken pox   . Hyperlipidemia, unspecified   . Hypothyroidism, unspecified   . Macular degeneration   . Tic dou[place]re[place]    history of  . Trigeminal neuralgia   . Unspecified atrial fibrillation [facility])    Past Surgical History:  Procedure Laterality Date  . ANAL FISSURECTOMY  1998  . CATARACT EXTRACTION Left 1999  . STEREOTACTIC RADIOSURGERY / PALLIDOTOMY  10/17/2001   Trigeminal neuralgia, right  . TONSILLECTOMY      Allergies  Allergen Reactions  . Tegretol [Carbamazepine] Rash    Allergies as of 02/09/2017      Reactions   Tegretol [carbamazepine] Rash      Medication List       Accurate as of 02/09/17 11:21 AM. Always use your most recent med list.          acetaminophen 325 MG tablet Commonly known as:  TYLENOL Take 650 mg by mouth every 4 (four) hours as needed. Pain and/or increased temp   ENDIT EX Apply liberal amount topically to areas of skin irritation 3 times daily and as needed   nitroGLYCERIN 0.1 mg/hr patch Commonly known as:  NITRODUR - Dosed in mg/24 hr Place 0.1 mg onto the skin daily. Apply every morning and remove at bedtime   NUTRITIONAL SUPPLEMENTS PO Take Magic Cup by mouth 3 times daily between meals.   trolamine salicylate 10 % cream Commonly known as:  ASPERCREME Massage topically into both hands and both feet/toes/ankles every 12 hours for bilateral hand/foot/toe pain.       Review of Systems  Unable to perform ROS: Dementia  Constitutional: Positive for fatigue. Negative for activity change, appetite change, chills, diaphoresis and fever.  HENT: Negative for congestion, sneezing, sore throat, trouble swallowing and voice change.   Eyes: Negative.   Respiratory: Negative for apnea, cough, choking, chest tightness, shortness of breath and wheezing.   Cardiovascular: Negative for chest pain, palpitations and leg swelling.  Gastrointestinal: Negative.   Genitourinary: Negative.   Musculoskeletal: Positive for arthralgias (typical arthritis). Negative for back pain, gait problem and myalgias.  Skin: Positive for pallor. Negative for color change, rash and  wound.  Allergic/Immunologic: Negative for environmental allergies and food allergies.  Neurological: Negative for dizziness, tremors, syncope, speech difficulty, weakness, numbness and headaches.  Hematological: Negative.   Psychiatric/Behavioral: Negative.   All other systems reviewed and are negative.   Immunization History  Administered Date(s) Administered  . Influenza-Unspecified 03/14/2014, 02/12/2015, 03/12/2016  . PPD Test 03/24/2016  . Pneumococcal-Unspecified 05/05/2014   Pertinent  Health Maintenance Due  Topic Date Due  . PNA vac Low Risk Adult (2 of 2 - PCV13) 05/06/2015  . INFLUENZA VACCINE  01/10/2017   No flowsheet data found. Functional Status Survey:    Vitals:   02/09/17 1107  BP: 116/71  Pulse: 70  Resp: 16  Temp: 97.9 F (36.6 C)  SpO2: 96%  Weight: 132 lb 3.2 oz (60 kg)  Height: 5\' 11"  (1.803 m)   Body mass index is 18.44 kg/m. Physical Exam  Constitutional: He is oriented to person, place, and time. He appears well-developed. He appears cachectic. He is active and cooperative. He does not appear ill. No distress.  HENT:  Head: Normocephalic and atraumatic.  Mouth/Throat: Uvula is midline, oropharynx is clear and moist and mucous membranes are normal. Mucous membranes are not pale, not dry and not cyanotic.  Eyes: Pupils are equal, round, and reactive to light. Conjunctivae, EOM and lids are normal.  Neck: Trachea normal, normal range of motion and full passive range of motion without pain. Neck supple. No JVD present. No tracheal deviation, no edema and no erythema present. No thyromegaly present.  Cardiovascular: Regular rhythm.  Bradycardia present.  Exam reveals decreased pulses. Exam reveals no gallop, no distant heart sounds and no friction rub.   Murmur heard. Pulmonary/Chest: Effort normal and breath sounds normal. No accessory muscle usage. No apnea and no tachypnea. No respiratory distress. He has no decreased breath sounds. He has no  wheezes. He has no rhonchi. He has no rales. He exhibits no tenderness.  Abdominal: Soft. Normal appearance. He exhibits no distension and no ascites. Bowel sounds are decreased. There is no tenderness.  Musculoskeletal: Normal range of motion. He exhibits no edema or tenderness.  Expected osteoarthritis, stiffness  Neurological: He is alert and oriented to person, place, and time. He has normal strength.  Skin: Skin is warm, dry and intact. No rash noted. He is not diaphoretic. No cyanosis or erythema. No pallor. Nails show no clubbing.  Psychiatric: He has a normal mood and affect. His speech is normal and behavior is normal. Judgment and thought content normal. Cognition and memory are normal.  Nursing note and vitals reviewed.   Labs reviewed: No results for input(s): NA, K, CL, CO2, GLUCOSE, BUN, CREATININE, CALCIUM, MG, PHOS in the last 8760 hours. No results for input(s): AST, ALT, ALKPHOS, BILITOT, PROT, ALBUMIN in the last 8760 hours. No results for input(s): WBC, NEUTROABS, HGB, HCT, MCV, PLT in the last 8760 hours. Lab Results  Component Value Date   TSH 0.591 07/09/2014   No results  found for: HGBA1C No results found for: CHOL, HDL, LDLCALC, LDLDIRECT, TRIG, CHOLHDL  Significant Diagnostic Results in last 30 days:  No results found.  Assessment/Plan 1. Spinal stenosis, lumbar region, without neurogenic claudication  Stable   2. Macular degeneration, unspecified laterality, unspecified type  Stable   3. Legal blindness, as defined in [place]  Stable   4. Atrial fibrillation, unspecified type (HCC)  Stable   5. Nonrheumatic aortic (valve) stenosis  Stable   6. Atherosclerosis of native coronary artery of native heart without angina pectoris  Stable   Continue Nitroglycerin 0.1 mg/ hr patch Q Am, remove at HS  7. Peripheral vascular disease, unspecified (HCC)  Stable   8. Hyperlipidemia, unspecified hyperlipidemia type  Stable   9. Hypothyroidism,  unspecified type  Stable   10. Benign prostatic hyperplasia without lower urinary tract symptoms  Stable   11. Trigeminal neuralgia  Stable   12. Constipation, unspecified constipation type  Stable   13. Hx of falling  Stable   14. Dysphagia, oropharyngeal phase  Stable   15. Osteoarthritis, unspecified osteoarthritis type, unspecified site  Stable   Continue Aspercreme 10%- small amount to hands, feet, toes, ankles Q 12 hours for pain  Continue Acetaminophen 650 mg po Q 4 hours prn  16. Dementia in other diseases classified elsewhere without behavioral disturbance  Stable   Continue on Hospice services for symptom management  Family/ staff Communication:   Total Time:  Documentation:  Face to Face:  Family/Phone:   Labs/tests ordered:  none  Medication list reviewed and assessed for continued appropriateness. Monthly medication orders reviewed and signed.  Brynda Rim, NP-C Geriatrics [facility] [phone] [address], [place] [address] Cell Phone (Mon-Fri 8am-5pm):  [phone] On Call:  [phone] & follow prompts after 5pm & weekends Office Phone:  [phone] Office Fax:  [phone]

## 2017-02-10 ENCOUNTER — Encounter
Admission: RE | Admit: 2017-02-10 | Discharge: 2017-02-10 | Disposition: A | Discharge status: Home / Self Care | Payer: Medicare Other | Source: Ambulatory Visit | Attending: Internal Medicine | Admitting: Internal Medicine

## 2017-03-09 ENCOUNTER — Encounter: Payer: Self-pay | Admitting: Gerontology

## 2017-03-09 ENCOUNTER — Non-Acute Institutional Stay (SKILLED_NURSING_FACILITY): Payer: Medicare Other | Admitting: Gerontology

## 2017-03-09 DIAGNOSIS — K5900 Constipation, unspecified: Secondary | ICD-10-CM | POA: Diagnosis not present

## 2017-03-09 DIAGNOSIS — H3530 Unspecified macular degeneration: Secondary | ICD-10-CM

## 2017-03-09 DIAGNOSIS — I4891 Unspecified atrial fibrillation: Secondary | ICD-10-CM

## 2017-03-09 DIAGNOSIS — G500 Trigeminal neuralgia: Secondary | ICD-10-CM

## 2017-03-09 DIAGNOSIS — E039 Hypothyroidism, unspecified: Secondary | ICD-10-CM

## 2017-03-09 DIAGNOSIS — E785 Hyperlipidemia, unspecified: Secondary | ICD-10-CM | POA: Diagnosis not present

## 2017-03-09 DIAGNOSIS — I739 Peripheral vascular disease, unspecified: Secondary | ICD-10-CM | POA: Diagnosis not present

## 2017-03-09 DIAGNOSIS — I350 Nonrheumatic aortic (valve) stenosis: Secondary | ICD-10-CM | POA: Diagnosis not present

## 2017-03-09 DIAGNOSIS — M1990 Unspecified osteoarthritis, unspecified site: Secondary | ICD-10-CM

## 2017-03-09 DIAGNOSIS — M48061 Spinal stenosis, lumbar region without neurogenic claudication: Secondary | ICD-10-CM | POA: Diagnosis not present

## 2017-03-09 DIAGNOSIS — H548 Legal blindness, as defined in USA: Secondary | ICD-10-CM

## 2017-03-09 DIAGNOSIS — F0280 Dementia in other diseases classified elsewhere without behavioral disturbance: Secondary | ICD-10-CM

## 2017-03-09 DIAGNOSIS — N400 Enlarged prostate without lower urinary tract symptoms: Secondary | ICD-10-CM

## 2017-03-09 DIAGNOSIS — I2510 Atherosclerotic heart disease of native coronary artery without angina pectoris: Secondary | ICD-10-CM

## 2017-03-09 DIAGNOSIS — Z9181 History of falling: Secondary | ICD-10-CM

## 2017-03-09 DIAGNOSIS — R1312 Dysphagia, oropharyngeal phase: Secondary | ICD-10-CM

## 2017-03-09 NOTE — Progress Notes (Signed)
.  Location:   [facility] Room Number: 343P Place of Service:  SNF [phone]) Provider:  Lorenso Quarry, NP-C  No primary care provider on file.  No care team member to display  Extended Emergency Contact Information Primary Emergency Contact: [facility] Address: [address] Home Phone: [phone] Relation: None Secondary Emergency Contact: Simmons,Ilene Address: [address] [place] [address] Darden Amber of [place] Home Phone: [phone] Mobile Phone: [phone] Relation: Daughter  Code Status:  DNR Goals of care: Advanced Directive information Advanced Directives 03/09/2017  Does Patient Have a Medical Advance Directive? Yes  Type of Estate agent of [place];Out of facility DNR (pink MOST or yellow form)  Does patient want to make changes to medical advance directive? No - Patient declined  Copy of Healthcare Power of Attorney in Chart? Yes  Pre-existing out of facility DNR order (yellow form or pink MOST form) Yellow form placed in chart (order not valid for inpatient use)     Chief Complaint  Patient presents with  . Medical Management of Chronic Issues    Routine Visit    HPI:  Pt is a 83 y.o. male seen today for medical management of chronic diseases. Pt sleeps the majority of the time. Awakes to eat some, then goes back to sleep. Pt did not open his eyes during exam. Appetite fair. All chronic conditions are stable, no medication treatment at this time aside from Nitro patch and Aspercreme. Pt remains on Hospice services. Pt has a very slow decline. Not in any distress. VSS.     Past Medical History:  Diagnosis Date  . Anemia, unspecified   . Aortic stenosis   . Bladder neck obstruction   . Glaucoma   . History of chicken pox   . Hyperlipidemia, unspecified   . Hypothyroidism, unspecified   . Macular degeneration   . Tic [place]    history of  . Trigeminal neuralgia   .  Unspecified atrial fibrillation [facility])    Past Surgical History:  Procedure Laterality Date  . ANAL FISSURECTOMY  1998  . CATARACT EXTRACTION Left 1999  . STEREOTACTIC RADIOSURGERY / PALLIDOTOMY  10/17/2001   Trigeminal neuralgia, right  . TONSILLECTOMY      Allergies  Allergen Reactions  . Tegretol [Carbamazepine] Rash    Allergies as of 03/09/2017      Reactions   Tegretol [carbamazepine] Rash      Medication List       Accurate as of 03/09/17 10:27 AM. Always use your most recent med list.          acetaminophen 325 MG tablet Commonly known as:  TYLENOL Take 650 mg by mouth every 4 (four) hours as needed. Pain and/or increased temp   ENDIT EX Apply liberal amount topically to areas of skin irritation 3 times daily and as needed   nitroGLYCERIN 0.1 mg/hr patch Commonly known as:  NITRODUR - Dosed in mg/24 hr Place 0.1 mg onto the skin daily. Apply every morning and remove at bedtime   NUTRITIONAL SUPPLEMENTS PO Take Magic Cup by mouth 3 times daily between meals.   trolamine salicylate 10 % cream Commonly known as:  ASPERCREME Massage topically into both hands and both feet/toes/ankles every 12 hours for bilateral hand/foot/toe pain.       Review of Systems  Unable to perform ROS: Dementia  Constitutional: Positive for fatigue.  Negative for activity change, appetite change, chills, diaphoresis and fever.  HENT: Negative for congestion, sneezing, sore throat, trouble swallowing and voice change.   Eyes: Negative.   Respiratory: Negative for apnea, cough, choking, chest tightness, shortness of breath and wheezing.   Cardiovascular: Negative for chest pain, palpitations and leg swelling.  Gastrointestinal: Negative.   Genitourinary: Negative.   Musculoskeletal: Positive for arthralgias (typical arthritis). Negative for back pain, gait problem and myalgias.  Skin: Negative for color change, rash and wound.  Allergic/Immunologic: Negative for environmental  allergies and food allergies.  Neurological: Negative for dizziness, tremors, syncope, speech difficulty, weakness, numbness and headaches.  Hematological: Negative.   Psychiatric/Behavioral: Negative.   All other systems reviewed and are negative.   Immunization History  Administered Date(s) Administered  . Influenza-Unspecified 03/14/2014, 02/12/2015, 03/12/2016, 03/05/2017  . PPD Test 03/24/2016  . Pneumococcal-Unspecified 05/05/2014   Pertinent  Health Maintenance Due  Topic Date Due  . PNA vac Low Risk Adult (2 of 2 - PCV13) 05/06/2015  . INFLUENZA VACCINE  Completed   No flowsheet data found. Functional Status Survey:    Vitals:   03/09/17 1021  BP: 116/71  Pulse: 70  Resp: 16  Temp: 97.9 F (36.6 C)  SpO2: 96%  Weight: 130 lb 4.8 oz (59.1 kg)  Height:  (1.803 m)   Body mass index is 18.17 kg/m. Physical Exam  Constitutional: He is oriented to person, place, and time. He appears well-developed. He appears cachectic. He is active and cooperative. He does not appear ill. No distress.  HENT:  Head: Normocephalic and atraumatic.  Mouth/Throat: Uvula is midline, oropharynx is clear and moist and mucous membranes are normal. Mucous membranes are not pale, not dry and not cyanotic.  Eyes: Pupils are equal, round, and reactive to light. Conjunctivae, EOM and lids are normal.  Neck: Trachea normal, normal range of motion and full passive range of motion without pain. Neck supple. No JVD present. No tracheal deviation, no edema and no erythema present. No thyromegaly present.  Cardiovascular: Regular rhythm.  Bradycardia present.  Exam reveals decreased pulses. Exam reveals no gallop, no distant heart sounds and no friction rub.   Murmur heard. Pulmonary/Chest: Effort normal and breath sounds normal. No accessory muscle usage. No apnea and no tachypnea. No respiratory distress. He has no decreased breath sounds. He has no wheezes. He has no rhonchi. He has no rales. He  exhibits no tenderness.  Abdominal: Soft. Normal appearance. He exhibits no distension and no ascites. Bowel sounds are decreased. There is no tenderness.  Musculoskeletal: Normal range of motion. He exhibits no edema or tenderness.  Expected osteoarthritis, stiffness  Neurological: He is alert and oriented to person, place, and time. He has normal strength.  Skin: Skin is warm, dry and intact. No rash noted. He is not diaphoretic. No cyanosis or erythema. No pallor. Nails show no clubbing.  Psychiatric: He has a normal mood and affect. His speech is normal and behavior is normal. Judgment and thought content normal. Cognition and memory are normal.  Nursing note and vitals reviewed.   Labs reviewed: No results for input(s): NA, K, CL, CO2, GLUCOSE, BUN, CREATININE, CALCIUM, MG, PHOS in the last 8760 hours. No results for input(s): AST, ALT, ALKPHOS, BILITOT, PROT, ALBUMIN in the last 8760 hours. No results for input(s): WBC, NEUTROABS, HGB, HCT, MCV, PLT in the last 8760 hours. Lab Results  Component Value Date   TSH 0.591 07/09/2014   No results found for: HGBA1C No results found  for: CHOL, HDL, LDLCALC, LDLDIRECT, TRIG, CHOLHDL  Significant Diagnostic Results in last 30 days:  No results found.  Assessment/Plan 1. Spinal stenosis, lumbar region, without neurogenic claudication  Stable   2. Macular degeneration, unspecified laterality, unspecified type  Stable   3. Legal blindness, as defined in [place]  Stable   4. Atrial fibrillation, unspecified type (HCC)  Stable   5. Nonrheumatic aortic (valve) stenosis  Stable   6. Atherosclerosis of native coronary artery of native heart without angina pectoris  Stable   Continue Nitroglycerin 0.1 mg/ hr patch Q Am, remove at HS  7. Peripheral vascular disease, unspecified (HCC)  Stable   8. Hyperlipidemia, unspecified hyperlipidemia type  Stable   9. Hypothyroidism, unspecified type  Stable   10. Benign prostatic  hyperplasia without lower urinary tract symptoms  Stable   11. Trigeminal neuralgia  Stable   12. Constipation, unspecified constipation type  Stable   13. Hx of falling  Stable   14. Dysphagia, oropharyngeal phase  Stable   15. Osteoarthritis, unspecified osteoarthritis type, unspecified site  Stable   Continue Aspercreme 10%- small amount to hands, feet, toes, ankles Q 12 hours for pain  Continue Acetaminophen 650 mg po Q 4 hours prn  16. Dementia in other diseases classified elsewhere without behavioral disturbance  Stable   Continue on Hospice services for symptom management  Continue all medications as listed above unless otherwise indicated.  Family/ staff Communication:   Total Time:  Documentation:  Face to Face:  Family/Phone:   Labs/tests ordered:  none  Medication list reviewed and assessed for continued appropriateness. Monthly medication orders reviewed and signed.  Brynda Rim, NP-C Geriatrics [facility] [phone] [address], [place] [address] Cell Phone (Mon-Fri 8am-5pm):  [phone] On Call:  [phone] & follow prompts after 5pm & weekends Office Phone:  [phone] Office Fax:  [phone]

## 2017-03-12 ENCOUNTER — Encounter
Admission: RE | Admit: 2017-03-12 | Discharge: 2017-03-12 | Disposition: A | Discharge status: Home / Self Care | Payer: Medicare Other | Source: Ambulatory Visit | Attending: Internal Medicine | Admitting: Internal Medicine

## 2017-04-12 ENCOUNTER — Encounter
Admission: RE | Admit: 2017-04-12 | Discharge: 2017-04-12 | Disposition: A | Discharge status: Home / Self Care | Payer: Medicare Other | Source: Ambulatory Visit | Attending: Internal Medicine | Admitting: Internal Medicine

## 2017-04-13 ENCOUNTER — Non-Acute Institutional Stay (SKILLED_NURSING_FACILITY): Payer: Medicare Other | Admitting: Gerontology

## 2017-04-13 ENCOUNTER — Encounter: Payer: Self-pay | Admitting: Gerontology

## 2017-04-13 DIAGNOSIS — Z9181 History of falling: Secondary | ICD-10-CM

## 2017-04-13 DIAGNOSIS — E039 Hypothyroidism, unspecified: Secondary | ICD-10-CM | POA: Diagnosis not present

## 2017-04-13 DIAGNOSIS — K5900 Constipation, unspecified: Secondary | ICD-10-CM

## 2017-04-13 DIAGNOSIS — G500 Trigeminal neuralgia: Secondary | ICD-10-CM | POA: Diagnosis not present

## 2017-04-13 DIAGNOSIS — N400 Enlarged prostate without lower urinary tract symptoms: Secondary | ICD-10-CM | POA: Diagnosis not present

## 2017-04-13 DIAGNOSIS — E785 Hyperlipidemia, unspecified: Secondary | ICD-10-CM | POA: Diagnosis not present

## 2017-04-13 DIAGNOSIS — I2510 Atherosclerotic heart disease of native coronary artery without angina pectoris: Secondary | ICD-10-CM

## 2017-04-13 DIAGNOSIS — I4891 Unspecified atrial fibrillation: Secondary | ICD-10-CM

## 2017-04-13 DIAGNOSIS — M48061 Spinal stenosis, lumbar region without neurogenic claudication: Secondary | ICD-10-CM

## 2017-04-13 DIAGNOSIS — I350 Nonrheumatic aortic (valve) stenosis: Secondary | ICD-10-CM | POA: Diagnosis not present

## 2017-04-13 DIAGNOSIS — I739 Peripheral vascular disease, unspecified: Secondary | ICD-10-CM

## 2017-04-13 DIAGNOSIS — R1312 Dysphagia, oropharyngeal phase: Secondary | ICD-10-CM

## 2017-04-13 DIAGNOSIS — M1990 Unspecified osteoarthritis, unspecified site: Secondary | ICD-10-CM

## 2017-04-13 DIAGNOSIS — H3530 Unspecified macular degeneration: Secondary | ICD-10-CM

## 2017-04-13 DIAGNOSIS — F0280 Dementia in other diseases classified elsewhere without behavioral disturbance: Secondary | ICD-10-CM

## 2017-04-13 DIAGNOSIS — H548 Legal blindness, as defined in USA: Secondary | ICD-10-CM

## 2017-05-03 NOTE — Progress Notes (Signed)
.  Location:   [facility] Room Number: 343P Place of Service:  SNF [phone]) Provider:  Lorenso Quarry, NP-C  No primary care provider on file.  No care team member to display  Extended Emergency Contact Information Primary Emergency Contact: [facility] Address: [address] Home Phone: [phone] Relation: None Secondary Emergency Contact: Simmons,Ilene Address: [address] [place] [address] Darden [place] of [place] Home Phone: [phone] Mobile Phone: [phone] Relation: Daughter  Code Status:  DNR Goals of care: Advanced Directive information Advanced Directives 04/13/2017  Does Patient Have a Medical Advance Directive? Yes  Type of Estate agent of [place];Out of facility DNR (pink MOST or yellow form)  Does patient want to make changes to medical advance directive? No - Patient declined  Copy of Healthcare Power of Attorney in Chart? Yes  Pre-existing out of facility DNR order (yellow form or pink MOST form) Yellow form placed in chart (order not valid for inpatient use)     Chief Complaint  Patient presents with  . Medical Management of Chronic Issues    Routine Visit    HPI:  Pt is a 83 y.o. male seen today for medical management of chronic diseases.  Patient sleeps the majority of the time.  Awakens to eat some, then goes back to sleep.  Patient did not open his eyes during exam.  Appetite fair.  All chronic conditions are stable, no medication treatment at this time aside from nitro patch and Aspercreme.  Patient remains on hospice services.  Patient has a very slow decline.  Not in any distress.  Vital signs stable.  Past Medical History:  Diagnosis Date  . Anemia, unspecified   . Aortic stenosis   . Bladder neck obstruction   . Glaucoma   . History of chicken pox   . Hyperlipidemia, unspecified   . Hypothyroidism, unspecified   . Macular degeneration   . Tic douloureux    history of  . Trigeminal neuralgia   . Unspecified atrial fibrillation [facility])    Past Surgical History:  Procedure Laterality Date  . ANAL FISSURECTOMY  1998  . CATARACT EXTRACTION Left 1999  . STEREOTACTIC RADIOSURGERY / PALLIDOTOMY  10/17/2001   Trigeminal neuralgia, right  . TONSILLECTOMY      Allergies  Allergen Reactions  . Tegretol [Carbamazepine] Rash    Allergies as of 04/13/2017      Reactions   Tegretol [carbamazepine] Rash      Medication List        Accurate as of 04/13/17 11:59 PM. Always use your most recent med list.          acetaminophen 325 MG tablet Commonly known as:  TYLENOL Take 650 mg by mouth every 4 (four) hours as needed. Pain and/or increased temp   ENDIT EX Apply liberal amount topically to areas of skin irritation 3 times daily and as needed   nitroGLYCERIN 0.1 mg/hr patch Commonly known as:  NITRODUR - Dosed in mg/24 hr Place 0.1 mg onto the skin daily. Apply every morning and remove at bedtime   NUTRITIONAL SUPPLEMENTS PO Take Magic Cup by mouth 3 times daily between meals.   trolamine salicylate 10 % cream Commonly known as:  ASPERCREME Massage topically into both hands and both feet/toes/ankles every 12 hours for bilateral hand/foot/toe pain.       Review of Systems  Unable to  perform ROS: Dementia  Constitutional: Positive for fatigue. Negative for activity change, appetite change, chills, diaphoresis and fever.  HENT: Negative for congestion, sneezing, sore throat, trouble swallowing and voice change.   Eyes: Negative.   Respiratory: Negative for apnea, cough, choking, chest tightness, shortness of breath and wheezing.   Cardiovascular: Negative for chest pain, palpitations and leg swelling.  Gastrointestinal: Negative.   Genitourinary: Negative.   Musculoskeletal: Positive for arthralgias (typical arthritis). Negative for back pain, gait problem and myalgias.  Skin: Negative for color change, rash and wound.    Allergic/Immunologic: Negative for environmental allergies and food allergies.  Neurological: Negative for dizziness, tremors, syncope, speech difficulty, weakness, numbness and headaches.  Hematological: Negative.   Psychiatric/Behavioral: Negative.   All other systems reviewed and are negative.   Immunization History  Administered Date(s) Administered  . Influenza-Unspecified 03/14/2014, 02/12/2015, 03/12/2016, 03/05/2017  . PPD Test 03/24/2016  . Pneumococcal-Unspecified 05/05/2014   Pertinent  Health Maintenance Due  Topic Date Due  . PNA vac Low Risk Adult (2 of 2 - PCV13) 05/06/2015  . INFLUENZA VACCINE  Completed   No flowsheet data found. Functional Status Survey:    Vitals:   04/13/17 1130  BP: 117/82  Pulse: 72  Resp: (!) 24  Temp: 98.1 F (36.7 C)  SpO2: 98%  Weight: 123 lb 11.2 oz (56.1 kg)  Height: 5\' 11"  (1.803 m)   Body mass index is 17.25 kg/m. Physical Exam  Constitutional: He is oriented to person, place, and time. He appears well-developed. He appears lethargic. He appears cachectic. He is active and cooperative. He does not appear ill. No distress.  HENT:  Head: Normocephalic and atraumatic.  Mouth/Throat: Uvula is midline, oropharynx is clear and moist and mucous membranes are normal. Mucous membranes are not pale, not dry and not cyanotic.  Eyes: Conjunctivae, EOM and lids are normal. Pupils are equal, round, and reactive to light.  Neck: Trachea normal, normal range of motion and full passive range of motion without pain. Neck supple. No JVD present. No tracheal deviation, no edema and no erythema present. No thyromegaly present.  Cardiovascular: Regular rhythm. Bradycardia present. Exam reveals decreased pulses. Exam reveals no gallop, no distant heart sounds and no friction rub.  Murmur heard. Pulmonary/Chest: Effort normal and breath sounds normal. No accessory muscle usage. No apnea and no tachypnea. No respiratory distress. He has no decreased  breath sounds. He has no wheezes. He has no rhonchi. He has no rales. He exhibits no tenderness.  Abdominal: Soft. Normal appearance. He exhibits no distension and no ascites. Bowel sounds are decreased. There is no tenderness.  Musculoskeletal: Normal range of motion. He exhibits no edema or tenderness.  Expected osteoarthritis, stiffness  Neurological: He is oriented to person, place, and time. He has normal strength. He appears lethargic.  Skin: Skin is warm, dry and intact. No rash noted. He is not diaphoretic. No cyanosis or erythema. There is pallor. Nails show no clubbing.  Psychiatric: He has a normal mood and affect. His speech is normal and behavior is normal. Judgment and thought content normal. Cognition and memory are normal.  Nursing note and vitals reviewed.   Labs reviewed: No results for input(s): NA, K, CL, CO2, GLUCOSE, BUN, CREATININE, CALCIUM, MG, PHOS in the last 8760 hours. No results for input(s): AST, ALT, ALKPHOS, BILITOT, PROT, ALBUMIN in the last 8760 hours. No results for input(s): WBC, NEUTROABS, HGB, HCT, MCV, PLT in the last 8760 hours. Lab Results  Component Value Date   TSH  0.591 07/09/2014   No results found for: HGBA1C No results found for: CHOL, HDL, LDLCALC, LDLDIRECT, TRIG, CHOLHDL  Significant Diagnostic Results in last 30 days:  No results found.  Assessment/Plan 1. Spinal stenosis, lumbar region, without neurogenic claudication  Stable   2. Macular degeneration, unspecified laterality, unspecified type  Stable   3. Legal blindness, as defined in [place]  Stable   4. Atrial fibrillation, unspecified type (HCC)  Stable   5. Nonrheumatic aortic (valve) stenosis  Stable   6. Atherosclerosis of native coronary artery of native heart without angina pectoris  Stable   Continue Nitroglycerin 0.1 mg/ hr patch Q Am, remove at HS  7. Peripheral vascular disease, unspecified (HCC)  Stable   8. Hyperlipidemia, unspecified hyperlipidemia  type  Stable   9. Hypothyroidism, unspecified type  Stable   10. Benign prostatic hyperplasia without lower urinary tract symptoms  Stable   11. Trigeminal neuralgia  Stable   12. Constipation, unspecified constipation type  Stable   13. Hx of falling  Stable   14. Dysphagia, oropharyngeal phase  Stable   15. Osteoarthritis, unspecified osteoarthritis type, unspecified site  Stable   Continue Aspercreme 10%- small amount to hands, feet, toes, ankles Q 12 hours for pain  Continue Acetaminophen 650 mg po Q 4 hours prn  16. Dementia in other diseases classified elsewhere without behavioral disturbance  Stable   Continue on Hospice services for symptom management  Continue all medications as listed above   Family/ staff Communication:   Total Time:  Documentation:  Face to Face:  Family/Phone:   Labs/tests ordered:  none  Medication list reviewed and assessed for continued appropriateness. Monthly medication orders reviewed and signed.  Brynda RimShannon H. Michella Detjen, NP-C Geriatrics [facility] [phone] [address] [place], [place] [id number] Cell Phone (Mon-Fri 8am-5pm):  [phone] On Call:  [phone] & follow prompts after 5pm & weekends Office Phone:  [phone] Office Fax:  [phone]

## 2017-05-07 ENCOUNTER — Non-Acute Institutional Stay (SKILLED_NURSING_FACILITY): Payer: Medicare Other | Admitting: Gerontology

## 2017-05-07 DIAGNOSIS — R600 Localized edema: Secondary | ICD-10-CM | POA: Diagnosis not present

## 2017-05-07 NOTE — Progress Notes (Signed)
Location:      Place of Service:  Nursing (32) Provider:  Lorenso QuarryShannon Levana Minetti, NP-C  No primary care provider on file.  No care team member to display  Extended Emergency Contact Information Primary [facility] Contact[facility] Address: [address]          [place]KNOWN Home Phone: [phone] Relation: None Secondary Emergency Contact: Simmons,Ilene Address: [address]          [place], [place] [id number] [place] Home Phone: [phone] Mobile Phone: [phone] Relation: Daughter  Code Status:  DNR Goals of care: Advanced Directive information Advanced Directives 04/13/2017  Does Patient Have a Medical Advance Directive? Yes  Type of Estate agentAdvance Directive Healthcare Power of [place];Out of facility DNR (pink MOST or yellow form)  Does patient want to make changes to medical advance directive? No - Patient declined  Copy of Healthcare Power of Attorney in Chart? Yes  Pre-existing out of facility DNR order (yellow form or pink MOST form) Yellow form placed in chart (order not valid for inpatient use)     Chief Complaint  Patient presents with  . Acute Visit    HPI:  Pt is a [age] y.o. male seen today for an acute visit for evaluation of edema.  Nursing is concerned about patient's right hand.  The hand has been edematous for several days.  Nursing reports hand seems to be more swollen today and cool to touch.  On exam, right hand has 2+ edema from the fingers to the wrist.  Skin is cool.  Cap refills within normal limits.  Radial pulse faint but palpable.  Skin coloring within normal limits.  Patient is under hospice services and is minimally responsive.  Unable to give ROS.  Vital signs stable.   Past Medical History:  Diagnosis Date  . Anemia, unspecified   . Aortic stenosis   . Bladder neck obstruction   . Glaucoma   . History of chicken pox   . Hyperlipidemia, unspecified   . Hypothyroidism, unspecified   . Macular degeneration   . Tic douloureux    history of  .  Trigeminal neuralgia   . Unspecified atrial fibrillation [facility](HCC)    Past Surgical History:  Procedure Laterality Date  . ANAL FISSURECTOMY  1998  . CATARACT EXTRACTION Left 1999  . STEREOTACTIC RADIOSURGERY / PALLIDOTOMY  10/17/2001   Trigeminal neuralgia, right  . TONSILLECTOMY      Allergies  Allergen Reactions  . Tegretol [Carbamazepine] Rash    Allergies as of 05/07/2017      Reactions   Tegretol [carbamazepine] Rash      Medication List        Accurate as of 05/07/17  1:44 PM. Always use your most recent med list.          acetaminophen 325 MG tablet Commonly known as:  TYLENOL Take 650 mg by mouth every 4 (four) hours as needed. Pain and/or increased temp   ENDIT EX Apply liberal amount topically to areas of skin irritation 3 times daily and as needed   nitroGLYCERIN 0.1 mg/hr patch Commonly known as:  NITRODUR - Dosed in mg/24 hr Place 0.1 mg onto the skin daily. Apply every morning and remove at bedtime   NUTRITIONAL SUPPLEMENTS PO Take Magic Cup by mouth 3 times daily between meals.   trolamine salicylate 10 % cream Commonly known as:  ASPERCREME Massage topically into both hands and both feet/toes/ankles every 12 hours for bilateral hand/foot/toe pain.       Review  of Systems  Unable to perform ROS: Patient unresponsive  Constitutional: Positive for fatigue. Negative for activity change and appetite change.  HENT: Negative.   Respiratory: Negative.   Cardiovascular: Negative.   Gastrointestinal: Negative.   Genitourinary: Negative.   Musculoskeletal: Negative.   Neurological: Negative.   Psychiatric/Behavioral: Negative.     Immunization History  Administered Date(s) Administered  . Influenza-Unspecified 03/14/2014, 02/12/2015, 03/12/2016, 03/05/2017  . PPD Test 03/24/2016  . Pneumococcal-Unspecified 05/05/2014   Pertinent  Health Maintenance Due  Topic Date Due  . PNA vac Low Risk Adult (2 of 2 - PCV13) 05/06/2015  . INFLUENZA VACCINE   Completed   No flowsheet data found. Functional Status Survey:    Vitals:   05/01/17 2128  BP: 111/69  Pulse: 73  Resp: 16  Temp: 97.6 F (36.4 C)  SpO2: 97%  Weight: 124 lb 4.8 oz (56.4 kg)   Body mass index is 17.34 kg/m. Physical Exam  Constitutional: Vital signs are normal. He appears well-developed. He appears lethargic. He appears cachectic. He is active and cooperative. He does not appear ill. No distress.  HENT:  Head: Normocephalic and atraumatic.  Mouth/Throat: Uvula is midline, oropharynx is clear and moist and mucous membranes are normal. Mucous membranes are not pale, not dry and not cyanotic.  Eyes: Conjunctivae, EOM and lids are normal. Pupils are equal, round, and reactive to light.  Neck: Trachea normal, normal range of motion and full passive range of motion without pain. Neck supple. No JVD present. No tracheal deviation, no edema and no erythema present. No thyromegaly present.  Cardiovascular: Normal rate and intact distal pulses. An irregular rhythm present. Exam reveals no gallop, no distant heart sounds and no friction rub.  Murmur heard. Pulses:      Radial pulses are 1+ on the right side, and 1+ on the left side.       Dorsalis pedis pulses are 1+ on the right side, and 1+ on the left side.  2+ edema right hand  Pulmonary/Chest: Effort normal and breath sounds normal. No accessory muscle usage. No respiratory distress. He has no decreased breath sounds. He has no wheezes. He has no rhonchi. He has no rales. He exhibits no tenderness.  Abdominal: Normal appearance and bowel sounds are normal. He exhibits no distension and no ascites. There is no tenderness.  Musculoskeletal: Normal range of motion. He exhibits no edema or tenderness.  Expected osteoarthritis, stiffness  Neurological: He has normal strength. He appears lethargic.  Skin: Skin is warm, dry and intact. He is not diaphoretic. No cyanosis. There is pallor. Nails show no clubbing.  Psychiatric:  He has a normal mood and affect. His speech is normal and behavior is normal. Judgment and thought content normal. Cognition and memory are normal.  Nursing note and vitals reviewed.   Labs reviewed: No results for input(s): NA, K, CL, CO2, GLUCOSE, BUN, CREATININE, CALCIUM, MG, PHOS in the last 8760 hours. No results for input(s): AST, ALT, ALKPHOS, BILITOT, PROT, ALBUMIN in the last 8760 hours. No results for input(s): WBC, NEUTROABS, HGB, HCT, MCV, PLT in the last 8760 hours. Lab Results  Component Value Date   TSH 0.591 07/09/2014   No results found for: HGBA1C No results found for: CHOL, HDL, LDLCALC, LDLDIRECT, TRIG, CHOLHDL  Significant Diagnostic Results in last 30 days:  No results found.  Assessment/Plan 1. Localized edema  Elevate hand on the left  Ensure hand/arm is not under the body when turned  Family/ staff Communication:  Total Time:  Documentation:  Face to Face:  Family/Phone:   Labs/tests ordered:    Medication list reviewed and assessed for continued appropriateness.  Brynda Rim, NP-C Geriatrics [facility] [phone], [place] [address] Cell Phone (Mon-Fri 8am-5pm):  [phone] On Call:  [phone] & follow prompts after 5pm & weekends Office Phone:  [phone] Office Fax:  [phone]

## 2017-05-12 ENCOUNTER — Encounter
Admission: RE | Admit: 2017-05-12 | Discharge: 2017-05-12 | Disposition: A | Discharge status: Home / Self Care | Payer: Medicare Other | Source: Ambulatory Visit | Attending: Internal Medicine | Admitting: Internal Medicine

## 2017-05-15 ENCOUNTER — Encounter: Payer: Self-pay | Admitting: Gerontology

## 2017-05-15 ENCOUNTER — Non-Acute Institutional Stay (SKILLED_NURSING_FACILITY): Payer: Medicare Other | Admitting: Gerontology

## 2017-05-15 DIAGNOSIS — E039 Hypothyroidism, unspecified: Secondary | ICD-10-CM | POA: Diagnosis not present

## 2017-05-15 DIAGNOSIS — M48061 Spinal stenosis, lumbar region without neurogenic claudication: Secondary | ICD-10-CM | POA: Diagnosis not present

## 2017-05-15 DIAGNOSIS — I739 Peripheral vascular disease, unspecified: Secondary | ICD-10-CM | POA: Diagnosis not present

## 2017-05-15 DIAGNOSIS — Z9181 History of falling: Secondary | ICD-10-CM

## 2017-05-15 DIAGNOSIS — I2510 Atherosclerotic heart disease of native coronary artery without angina pectoris: Secondary | ICD-10-CM | POA: Diagnosis not present

## 2017-05-15 DIAGNOSIS — I350 Nonrheumatic aortic (valve) stenosis: Secondary | ICD-10-CM | POA: Diagnosis not present

## 2017-05-15 DIAGNOSIS — F0280 Dementia in other diseases classified elsewhere without behavioral disturbance: Secondary | ICD-10-CM

## 2017-05-15 DIAGNOSIS — H3530 Unspecified macular degeneration: Secondary | ICD-10-CM

## 2017-05-15 DIAGNOSIS — H548 Legal blindness, as defined in USA: Secondary | ICD-10-CM

## 2017-05-15 DIAGNOSIS — N400 Enlarged prostate without lower urinary tract symptoms: Secondary | ICD-10-CM | POA: Diagnosis not present

## 2017-05-15 DIAGNOSIS — M1990 Unspecified osteoarthritis, unspecified site: Secondary | ICD-10-CM

## 2017-05-15 DIAGNOSIS — G500 Trigeminal neuralgia: Secondary | ICD-10-CM | POA: Diagnosis not present

## 2017-05-15 DIAGNOSIS — I4891 Unspecified atrial fibrillation: Secondary | ICD-10-CM

## 2017-05-15 DIAGNOSIS — E785 Hyperlipidemia, unspecified: Secondary | ICD-10-CM

## 2017-05-15 DIAGNOSIS — Z515 Encounter for palliative care: Secondary | ICD-10-CM

## 2017-05-15 DIAGNOSIS — R1312 Dysphagia, oropharyngeal phase: Secondary | ICD-10-CM

## 2017-05-15 DIAGNOSIS — K5900 Constipation, unspecified: Secondary | ICD-10-CM

## 2017-05-20 NOTE — Progress Notes (Signed)
.  [facility]   [facility] Room Number: 343P Place of Service:  SNF [phone] Provider:  Lorenso QuarryShannon Fatim Vanderschaaf, NP-C  No primary care provider on file.  No care team member to display  Extended Emergency Contact Information Primary Emergency Contact: [facility],DAVID Address: [address]          [place] Home Phone: [phone] Relation: None Secondary Emergency Contact: Simmons,Ilene Address: [address]          [place] [id number] [place] Home Phone: [phone] Mobile Phone: [phone] Relation: Daughter  Code Status:  DNR Goals of care: Advanced Directive information Advanced Directives 05/15/2017  Does Patient Have a Medical Advance Directive? Yes  Type of Advance Directive Out of facility DNR (pink MOST or yellow form);Healthcare Power of Attorney  Does patient want to make changes to medical advance directive? No - Patient declined  Copy of Healthcare Power of Attorney in Chart? Yes  Pre-existing out of facility DNR order (yellow form or pink MOST form) Yellow form placed in chart (order not valid for inpatient use)     Chief Complaint  Patient presents with  . Medical Management of Chronic Issues    Routine Visit    HPI:  Pt is a [age] y.o. male seen today for medical management of chronic diseases.  Pt sleeps the majority of the time. Awakens to eat some, then goes back to sleep.  Eating and awakening less as of late. Pt did not open his eyes during the exam. All chronic conditions are stable, no medication treatment at this time aside from nitro patch and Aspercreme. Pt remains on Hospice services. Pt has had a very slow decline, but decline seems to have accelerated. Beginning to have some intermittent gurgling. Pt appears to be approaching end of life. Pt is not in any distress at this time. VSS  Past Medical History:  Diagnosis Date  . Anemia, unspecified   . Aortic stenosis   . Bladder neck obstruction   . Glaucoma   . History of  chicken pox   . Hyperlipidemia, unspecified   . Hypothyroidism, unspecified   . Macular degeneration   . Tic [place]    history of  . Trigeminal neuralgia   . Unspecified atrial fibrillation [facility](HCC)    Past Surgical History:  Procedure Laterality Date  . ANAL FISSURECTOMY  1998  . CATARACT EXTRACTION Left 1999  . STEREOTACTIC RADIOSURGERY / PALLIDOTOMY  10/17/2001   Trigeminal neuralgia, right  . TONSILLECTOMY      Allergies  Allergen Reactions  . Tegretol [Carbamazepine] Rash    Allergies as of 05/15/2017      Reactions   Tegretol [carbamazepine] Rash      Medication List        Accurate as of 05/15/17 11:59 PM. Always use your most recent med list.          acetaminophen 325 MG tablet Commonly known as:  TYLENOL Take 650 mg by mouth every 4 (four) hours as needed. Pain and/or increased temp   ENDIT EX Apply liberal amount topically to areas of skin irritation 3 times daily and as needed   morphine 20 MG/ML concentrated solution Commonly known as:  ROXANOL Take 5-10 mg by mouth every hour as needed for moderate pain or severe pain.   nitroGLYCERIN 0.1 mg/hr patch Commonly known as:  NITRODUR - Dosed in mg/24 hr Place 0.1 mg onto the skin daily. Apply every morning and remove at bedtime   NUTRITIONAL  SUPPLEMENTS PO Take Magic Cup by mouth 3 times daily between meals.   TRANSDERM-SCOP (1.5 MG) 1 MG/3DAYS Generic drug:  scopolamine Place 1 patch onto the skin every 3 (three) days. Apply patch behind the ear, change every 3 days for secretions   trolamine salicylate 10 % cream Commonly known as:  ASPERCREME Massage topically into both hands and both feet/toes/ankles every 12 hours for bilateral hand/foot/toe pain.       Review of Systems  Unable to perform ROS: Dementia  Constitutional: Positive for fatigue. Negative for activity change, appetite change, chills, diaphoresis and fever.  HENT: Negative for congestion, sneezing, sore throat, trouble  swallowing and voice change.   Eyes: Negative.   Respiratory: Negative for apnea, cough, choking, chest tightness, shortness of breath and wheezing.   Cardiovascular: Negative for chest pain, palpitations and leg swelling.  Gastrointestinal: Negative.   Genitourinary: Negative.   Musculoskeletal: Positive for arthralgias (typical arthritis). Negative for back pain, gait problem and myalgias.  Skin: Negative for color change, rash and wound.  Allergic/Immunologic: Negative for environmental allergies and food allergies.  Neurological: Negative for dizziness, tremors, syncope, speech difficulty, weakness, numbness and headaches.  Hematological: Negative.   Psychiatric/Behavioral: Negative.   All other systems reviewed and are negative.   Immunization History  Administered Date(s) Administered  . Influenza-Unspecified 03/14/2014, 02/12/2015, 03/12/2016, 03/05/2017  . PPD Test 03/24/2016  . Pneumococcal-Unspecified 05/05/2014   Pertinent  Health Maintenance Due  Topic Date Due  . PNA vac Low Risk Adult (2 of 2 - PCV13) 05/06/2015  . INFLUENZA VACCINE  Completed   No flowsheet data found. Functional Status Survey:    Vitals:   05/15/17 1055  BP: 111/69  Pulse: 73  Resp: 18  Temp: 97.6 F (36.4 C)  TempSrc: Oral  SpO2: 97%  Weight: 124 lb 4.8 oz (56.4 kg)  Height: 5\' 11"  (1.803 m)   Body mass index is 17.34 kg/m. Physical Exam  Constitutional: He is oriented to person, place, and time. He appears well-developed. He appears lethargic. He appears cachectic. He is active and cooperative. He does not appear ill. No distress.  HENT:  Head: Normocephalic and atraumatic.  Mouth/Throat: Uvula is midline, oropharynx is clear and moist and mucous membranes are normal. Mucous membranes are not pale, not dry and not cyanotic.  Eyes: Conjunctivae, EOM and lids are normal. Pupils are equal, round, and reactive to light.  Neck: Trachea normal, normal range of motion and full passive  range of motion without pain. Neck supple. No JVD present. No tracheal deviation, no edema and no erythema present. No thyromegaly present.  Cardiovascular: Regular rhythm. Bradycardia present. Exam reveals decreased pulses. Exam reveals no gallop, no distant heart sounds and no friction rub.  Murmur heard. Pulmonary/Chest: Effort normal and breath sounds normal. No accessory muscle usage. No apnea and no tachypnea. No respiratory distress. He has no decreased breath sounds. He has no wheezes. He has no rhonchi. He has no rales. He exhibits no tenderness.  Abdominal: Soft. Normal appearance. He exhibits no distension and no ascites. Bowel sounds are decreased. There is no tenderness.  Musculoskeletal: Normal range of motion. He exhibits no edema or tenderness.  Expected osteoarthritis, stiffness  Neurological: He is oriented to person, place, and time. He has normal strength. He appears lethargic.  Skin: Skin is warm, dry and intact. No rash noted. He is not diaphoretic. No cyanosis or erythema. There is pallor. Nails show no clubbing.  Psychiatric: He has a normal mood and affect. His speech  is normal and behavior is normal. Judgment and thought content normal. Cognition and memory are normal.  Nursing note and vitals reviewed.   Labs reviewed: No results for input(s): NA, K, CL, CO2, GLUCOSE, BUN, CREATININE, CALCIUM, MG, PHOS in the last 8760 hours. No results for input(s): AST, ALT, ALKPHOS, BILITOT, PROT, ALBUMIN in the last 8760 hours. No results for input(s): WBC, NEUTROABS, HGB, HCT, MCV, PLT in the last 8760 hours. Lab Results  Component Value Date   TSH 0.591 07/09/2014   No results found for: HGBA1C No results found for: CHOL, HDL, LDLCALC, LDLDIRECT, TRIG, CHOLHDL  Significant Diagnostic Results in last 30 days:  No results found.  Assessment/Plan 1. Spinal stenosis, lumbar region, without neurogenic claudication  Stable   2. Macular degeneration, unspecified laterality,  unspecified type  Stable   3. Legal blindness, as defined in [place]  Stable   4. Atrial fibrillation, unspecified type (HCC)  Stable   5. Nonrheumatic aortic (valve) stenosis  Stable   6. Atherosclerosis of native coronary artery of native heart without angina pectoris  Stable   Continue Nitroglycerin 0.1 mg/ hr patch Q Am, remove at HS  7. Peripheral vascular disease, unspecified (HCC)  Stable   8. Hyperlipidemia, unspecified hyperlipidemia type  Stable   9. Hypothyroidism, unspecified type  Stable   10. Benign prostatic hyperplasia without lower urinary tract symptoms  Stable   11. Trigeminal neuralgia  Stable   12. Constipation, unspecified constipation type  Stable   13. Hx of falling  Stable   14. Dysphagia, oropharyngeal phase  Stable   15. Osteoarthritis, unspecified osteoarthritis type, unspecified site  Stable   Continue Aspercreme 10%- small amount to hands, feet, toes, ankles Q 12 hours for pain  Continue Acetaminophen 650 mg po Q 4 hours prn  16. Dementia in other diseases classified elsewhere without behavioral disturbance  Stable   Continue on Hospice services for symptom management  17.  Encounter for dying care  Continue Morphine 20 mg/ mL 0.25-035 mL po Q 1 hr prn- pain, dyspnea  Continue Scopolamine patch 1.5 mg TD Q 3 days for secretions  Continue Hospice services for symptom management  Continue all medications as listed above (05/15/17)  Family/ staff Communication:   Total Time:  Documentation:  Face to Face:  Family/Phone:   Labs/tests ordered:  none  Medication list reviewed and assessed for continued appropriateness. Monthly medication orders reviewed and signed.  Brynda RimShannon H. Angie Piercey, NP-C Geriatrics [facility] [phone] [place], [place] [id number] Cell Phone (Mon-Fri 8am-5pm):  [phone] On Call:  [phone] & follow prompts after 5pm & weekends Office Phone:   [phone] Office Fax:  [phone]

## 2017-06-12 DEATH — deceased

## 2019-12-30 NOTE — Progress Notes (Signed)
This encounter was created in error - please disregard.
